# Patient Record
Sex: Female | Born: 1965
Health system: Southern US, Community
[De-identification: ages and names within clinical notes are randomized; demographics above are authoritative.]

## PROBLEM LIST (undated history)

## (undated) DIAGNOSIS — F32A Depression, unspecified: Secondary | ICD-10-CM

## (undated) DIAGNOSIS — E785 Hyperlipidemia, unspecified: Secondary | ICD-10-CM

## (undated) DIAGNOSIS — D649 Anemia, unspecified: Secondary | ICD-10-CM

## (undated) DIAGNOSIS — R112 Nausea with vomiting, unspecified: Secondary | ICD-10-CM

---

## 2020-03-14 ENCOUNTER — Other Ambulatory Visit: Payer: Self-pay | Admitting: Orthopedic Surgery

## 2020-03-14 DIAGNOSIS — M545 Low back pain, unspecified: Secondary | ICD-10-CM

## 2020-03-18 ENCOUNTER — Other Ambulatory Visit: Payer: Self-pay

## 2020-03-18 ENCOUNTER — Ambulatory Visit (INDEPENDENT_AMBULATORY_CARE_PROVIDER_SITE_OTHER): Payer: BC Managed Care – PPO

## 2020-03-18 DIAGNOSIS — M545 Low back pain, unspecified: Secondary | ICD-10-CM

## 2021-11-06 IMAGING — MR MR LUMBAR SPINE W/O CM
4 of 5 series · 25 of 48 positions shown · non-contrast
Comparison: No pertinent prior studies available for comparison.

CLINICAL DATA: Low back pain, unspecified back pain laterality,
unspecified chronicity, unspecified whether sciatica present.
Evaluate neurologic compression. Additional history provided by
technologist: Patient reports extreme low back pain for 6 months,
bilateral leg weakness.

EXAM:
MRI LUMBAR SPINE WITHOUT CONTRAST
TECHNIQUE: Multiplanar, multisequence MR imaging of the lumbar spine was
performed. No intravenous contrast was administered.

[Series 4: T2 · sagittal · 4.0mm · 0.81mm/px · 6 of 15 slices shown (1 of 2)]
[im 1/15]
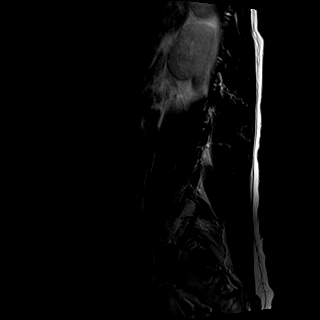
[im 3/15]
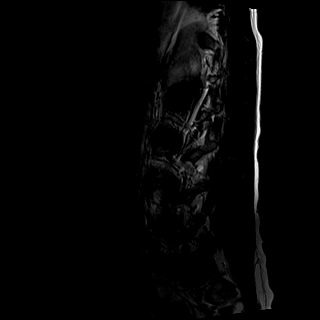
[im 6/15]
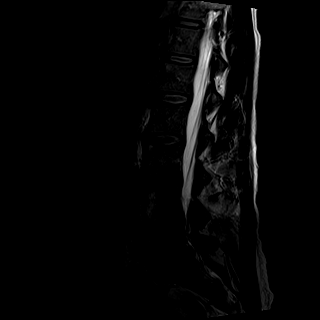
[im 9/15]
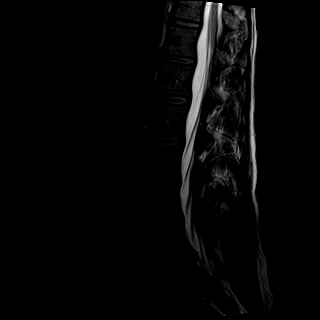
[im 12/15]
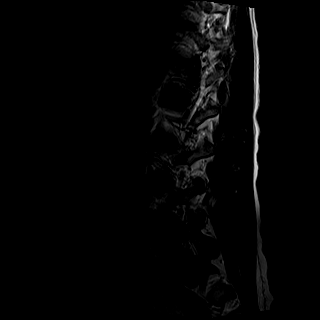
[im 15/15]
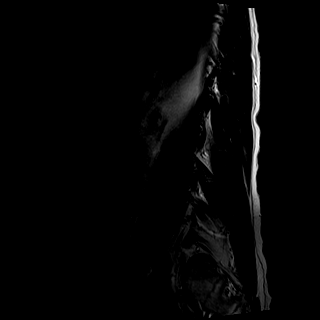

[Series 5: T1 · sagittal · 4.0mm · 0.41mm/px · 6 of 15 slices shown (1 of 2)]
[im 1/15]
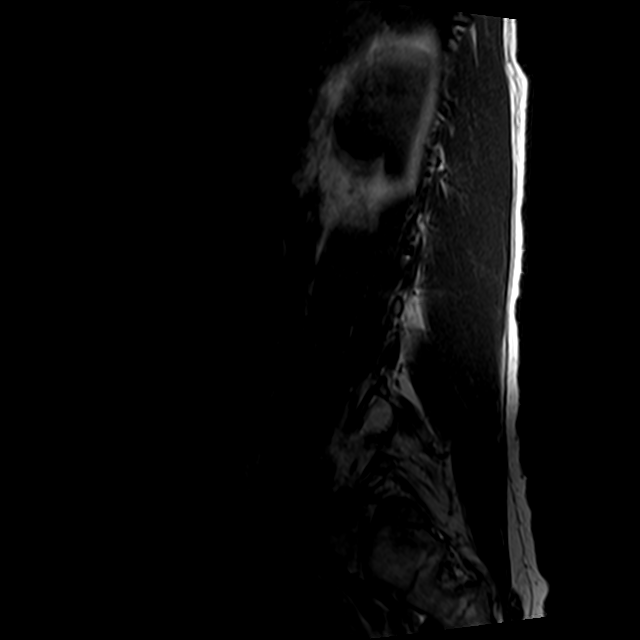
[im 3/15]
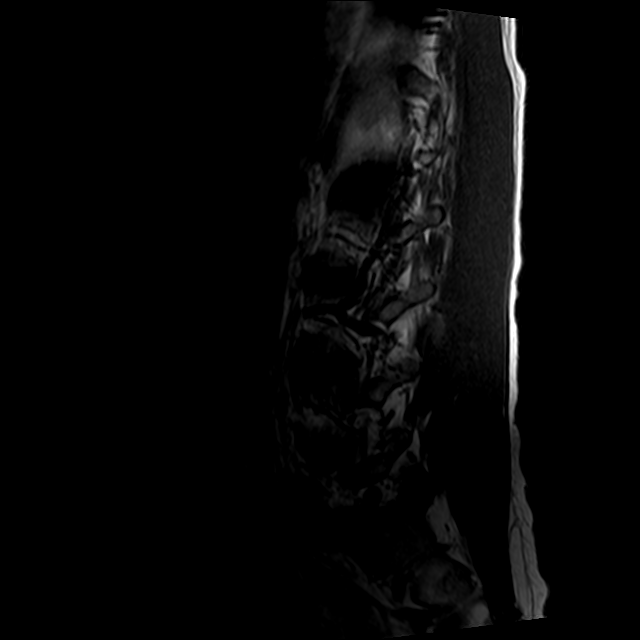
[im 6/15]
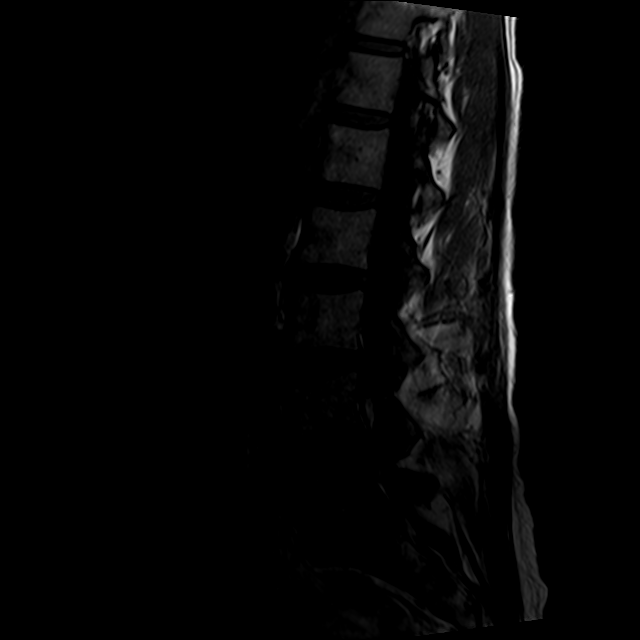
[im 9/15]
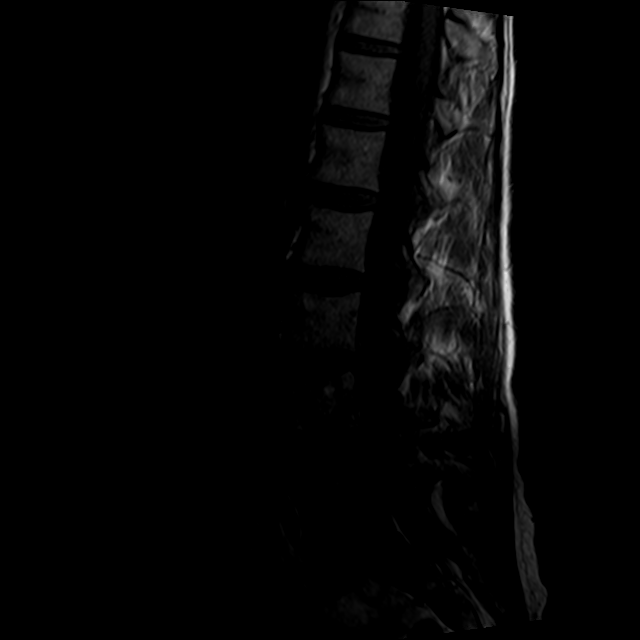
[im 12/15]
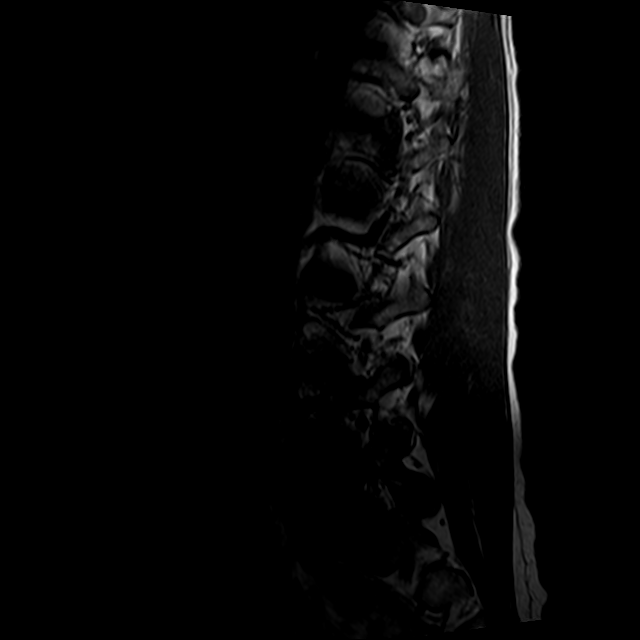
[im 15/15]
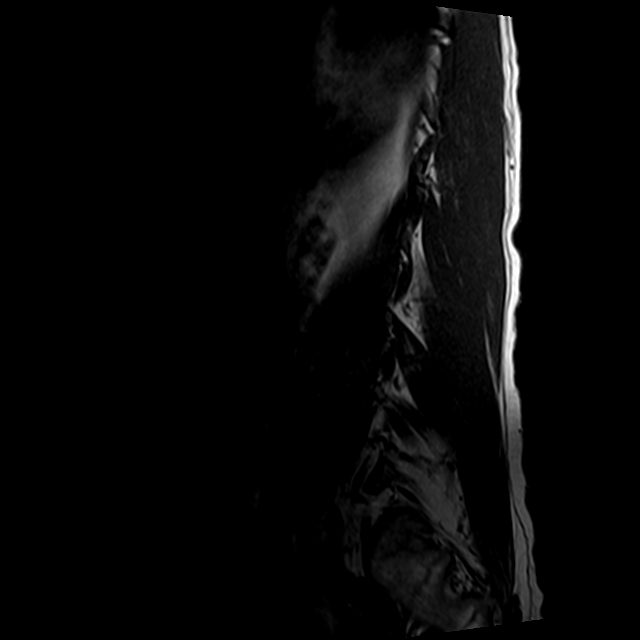

[Series 9: T2 · axial · 4.0mm · 0.78mm/px · z∈[-148,+64]mm · 9 of 39 slices shown (2 of 2)]
[im 1/39]
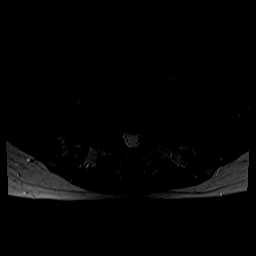
[im 6/39]
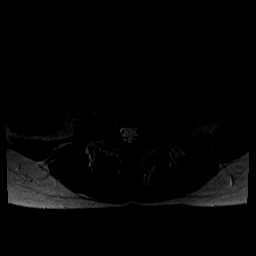
[im 11/39]
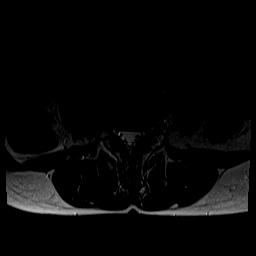
[im 17/39]
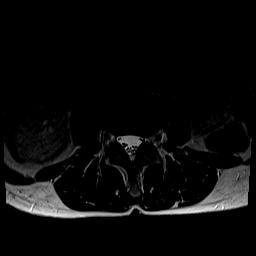
[im 20/39]
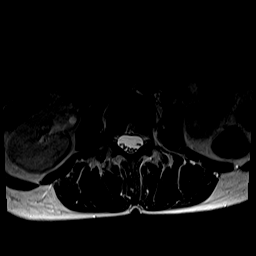
[im 22/39]
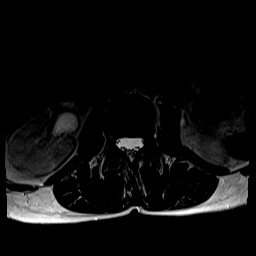
[im 28/39]
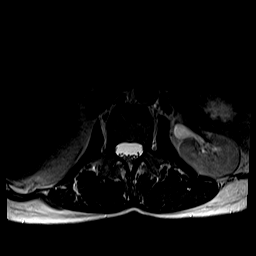
[im 33/39]
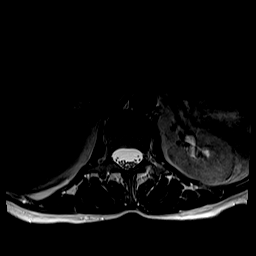
[im 39/39]
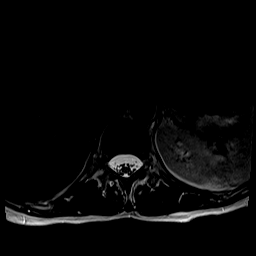

[Series 12: T1 · axial · 4.0mm · 0.39mm/px · z∈[-148,+36]mm · 4 of 39 slices shown (2 of 2)]
[im 1/39]
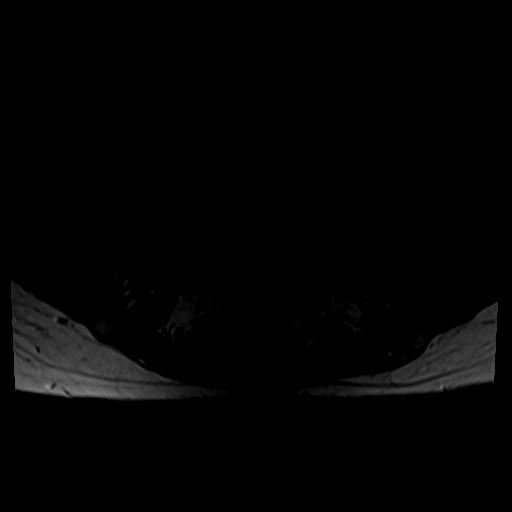
[im 6/39]
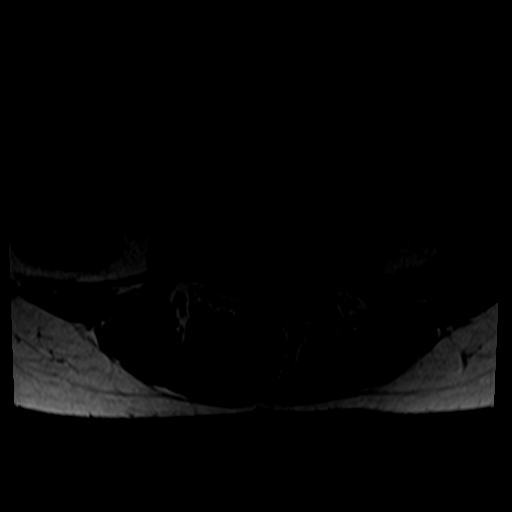
[im 20/39]
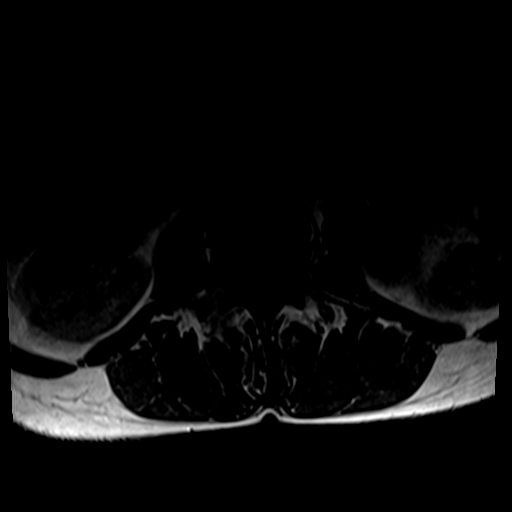
[im 33/39]
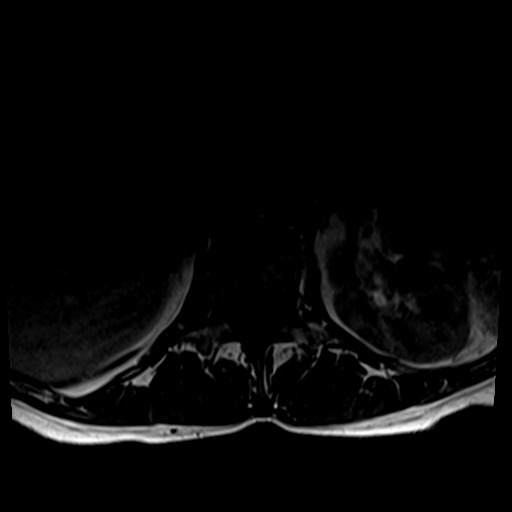

[25 of 48 positions shown; findings below may reference images not displayed]

FINDINGS: Segmentation: For the purposes of this dictation, five lumbar
vertebrae are assumed and the caudal most well-formed intervertebral
disc is designated L5-S1.

Alignment: Straightening of the expected lumbar lordosis. Trace
retrolisthesis at L3-L4, L4-L5 and L5-S1.

Vertebrae: Vertebral body height is maintained. Mild degenerative
endplate edema at L5-S1.

Conus medullaris and cauda equina: Conus extends to the T12-L1
level. No signal abnormality within the visualized distal spinal
cord.

Paraspinal and other soft tissues: No abnormality identified within
included portions of the abdomen/retroperitoneum. Visualized
paraspinal soft tissues within normal limits

Disc levels:

Moderate L5-S1 disc degeneration. Mild disc degeneration at L3-L4
and L4-L5.

T12-L1: No disc herniation. No significant canal or foraminal
stenosis.

L1-L2: No disc herniation. No significant canal or foraminal
stenosis.

L2-L3: No disc herniation. No significant canal or foraminal
stenosis.

L3-L4: Tiny left subarticular/foraminal disc protrusion at site of
posterior annular fissure. Mild facet arthrosis. No significant
spinal canal or foraminal narrowing.

L4-L5: Small disc bulge. Mild facet arthrosis/ligamentum flavum
hypertrophy. Mild left subarticular narrowing without nerve root
impingement. Central canal patent. No significant foraminal
stenosis.

L5-S1: Disc bulge. Superimposed shallow broad-based central disc
protrusion. Mild facet arthrosis/ligamentum flavum hypertrophy.
Mild/moderate bilateral subarticular narrowing with slight crowding
of the descending S1 nerve roots. Central canal patent.
Mild/moderate left neural foraminal narrowing.
IMPRESSION: Lumbar spondylosis as outlined and most notably as follows.

At L5-S1, there is moderate disc degeneration with mild degenerative
endplate edema. Disc bulge with superimposed shallow broad-based
central disc protrusion. Mild facet arthrosis/ligamentum flavum
hypertrophy. Mild/moderate bilateral subarticular narrowing with
slight crowding of the descending S1 nerve roots. Central canal
patent. Mild/moderate left neural foraminal narrowing.

At L4-L5, small disc bulge. Mild facet arthrosis/ligamentum flavum
hypertrophy. Mild left subarticular narrowing without nerve root
impingement. Central canal and neural foramina patent.

At L3-L4, tiny left subarticular/foraminal disc protrusion at site
of posterior annular fissure. Mild facet arthrosis. No significant
canal or foraminal stenosis.

## 2023-01-13 ENCOUNTER — Encounter: Payer: Self-pay | Admitting: Orthopaedic Surgery

## 2023-01-13 ENCOUNTER — Ambulatory Visit (INDEPENDENT_AMBULATORY_CARE_PROVIDER_SITE_OTHER): Payer: BC Managed Care – PPO | Admitting: Orthopaedic Surgery

## 2023-01-13 VITALS — Ht 63.0 in | Wt 109.6 lb

## 2023-01-13 DIAGNOSIS — M25551 Pain in right hip: Secondary | ICD-10-CM

## 2023-01-13 NOTE — Progress Notes (Signed)
The patient is a thin and active 57 year old female who saw me for second opinion as a relates to her right hip.  She started to develop right hip pain about a year ago and actually in January 2023 went to the emergency room because her hip was hurting quite a bit.  Her x-rays are negative and eventually she went through physical therapy for her hip.  She is also seeing a spine specialist.  She has had some knee pain on occasion on the right side but she does have stiffness in the right hip and some pain in the groin but also around the trochanteric area of her right hip.  She has seen a spine specialist and it sounds like she had an SI joint injection on the right side.  She has an MRI of her right hip that accompanies her.  I was able to look at the MRI and she does have a anterior superior labral tear but is not any significant degenerative changes at all around the femoral head or acetabulum.  The cartilage appears intact.  There is some fluid changes around the gluteus medius and minimus attachment area.  She did have an intra-articular injection in that right hip under fluoroscopic guidance back in December and she said that she cannot really tell a difference in terms of her being helped from that injection.  She has seen a hip arthroscopy specialist, Dr. Aretha Parrot, in [place].  Again she is a very thin individual and very active.  She does not have any significant medical problems at all.  On exam her right hip moves smoothly and fluidly with pain in the groin area on the strings or rotation but some pain over the proximal trochanteric area and some in the anterior thigh as well.  Her knee exam for me is normal and she appears very flexible with her spine.  I would ask like to send her to my partner Dr. Sammuel Hines to further evaluate her right hip in terms of any pathology around the trochanteric area that may need further intervention as well as assess her hip further given the labral tear.  She is just  a little frustrated in terms of how she feels however she is cautious and does want to make sure that she is doing the right things in terms of thinking about what can be done to help her feel better and does not want to rush directly to surgery unless there is a good idea of what may help her.  We will work on making that referral and she is glad to have a referral.  We gave her her MRI disc back to show Dr. Sammuel Hines as well.

## 2023-01-21 ENCOUNTER — Ambulatory Visit (INDEPENDENT_AMBULATORY_CARE_PROVIDER_SITE_OTHER): Payer: BC Managed Care – PPO | Admitting: Orthopaedic Surgery

## 2023-01-21 DIAGNOSIS — M25551 Pain in right hip: Secondary | ICD-10-CM

## 2023-01-21 DIAGNOSIS — M67959 Unspecified disorder of synovium and tendon, unspecified thigh: Secondary | ICD-10-CM

## 2023-01-21 DIAGNOSIS — M67951 Unspecified disorder of synovium and tendon, right thigh: Secondary | ICD-10-CM | POA: Diagnosis not present

## 2023-01-21 MED ORDER — TRIAMCINOLONE ACETONIDE 40 MG/ML IJ SUSP
80.0000 mg | INTRAMUSCULAR | Status: AC | PRN
  Administered 2023-01-21: 80 mg via INTRA_ARTICULAR

## 2023-01-21 MED ORDER — LIDOCAINE HCL 1 % IJ SOLN
4.0000 mL | INTRAMUSCULAR | Status: AC | PRN
  Administered 2023-01-21: 4 mL

## 2023-01-21 NOTE — Progress Notes (Signed)
Chief Complaint: Right hip pain     History of Present Illness:    Laura House is a 57 y.o. female presents today with ongoing right hip pain since December 2023.  She has previously seen by Dr. Aretha Parrot who was working on her right hip up and ultimately recommended right hip arthroscopy.  An injection was performed prior to this which gave her 2 weeks of relief.  Since this time she is having predominantly lateral based pain.  She does have a history of doing physical therapy for her back although nothing dedicated for the hip.  She does have a history of sharp stabbing pain down the anterior lateral thigh.  She is here today for further discussion.  She did see Dr. Ninfa Linden who sent her here today for further discussion and referral.  She was previously very active running multiple miles although this is stopped near completely as result of her hip pain    Surgical History:   none  PMH/PSH/Family History/Social History/Meds/Allergies:   No past medical history on file. No past surgical history on file. Social History   Socioeconomic History  . Marital status: Unknown    Spouse name: Not on file  . Number of children: Not on file  . Years of education: Not on file  . Highest education level: Not on file  Occupational History  . Not on file  Tobacco Use  . Smoking status: Not on file  . Smokeless tobacco: Not on file  Substance and Sexual Activity  . Alcohol use: Not on file  . Drug use: Not on file  . Sexual activity: Not on file  Other Topics Concern  . Not on file  Social History Narrative  . Not on file   Social Determinants of Health   Financial Resource Strain: Not on file  Food Insecurity: Not on file  Transportation Needs: Not on file  Physical Activity: Not on file  Stress: Not on file  Social Connections: Not on file   No family history on file. Not on File No current outpatient medications on file.   No current  facility-administered medications for this visit.   No results found.  Review of Systems:   A ROS was performed including pertinent positives and negatives as documented in the HPI.  Physical Exam :   Constitutional: NAD and appears stated age Neurological: Alert and oriented Psych: Appropriate affect and cooperative There were no vitals taken for this visit.   Comprehensive Musculoskeletal Exam:    Inspection Right Left  Skin No atrophy or gross abnormalities appreciated No atrophy or gross abnormalities appreciated  Palpation    Tenderness None None  Crepitus None None  Range of Motion    Flexion (passive) 120 120  Extension 30 30  IR 30 no pain 30  ER 45 45  Strength    Flexion  5/5 5/5  Extension 5/5 5/5  Special Tests    FABER Negative Negative  FADIR Negative Negative  ER Lag/Capsular Insufficiency Negative Negative  Instability Negative Negative  Sacroiliac pain Negative  Negative   Instability    Generalized Laxity No No  Neurologic    sciatic, femoral, obturator nerves intact to light sensation  Vascular/Lymphatic    DP pulse 2+ 2+  Lumbar Exam    Patient has symmetric lumbar range of  motion with negative pain referral to hip   She has positive pain with resisted hip abduction about the trochanter.  Imaging:   Xray (3 views right hip): Normal  MRI (right hip): There is a diminutive appearing labrum with an anterior superior labral tear.  There is tendinosis about the gluteus minimus and medius without a frank tear  I personally reviewed and interpreted the radiographs.   Assessment:   57 y.o. female with right hip pain that is consistent with right hip instability.  At today's visit I did describe that she does have quite a diminutive appearing labrum which is torn which I believe is creating a larger stress on her secondary hip stabilizers.  At today's visit the majority of her pain is gluteal in nature.  I believe that this is resulting from an  increased amount of stress about the lateral glutes as result of her labral tear.  I did describe that I do believe that her hip injection is likely still effective as she is still not having any groin pain.  Provocative labral maneuvers are not positive as well.  Today the majority of her pain appears to be lateral in nature.  Side effect I do believe that starting with an ultrasound guided injection about the glutes and a strong gluteal strengthening program could provide her with the strength and secondary stabilization needed to ultimately get her outcome that she is satisfied with and begin a good running program.  I will plan to proceed with this and I will see her back in 6 weeks for reassessment to check on her progress  Plan :    -Right hip ultrasound-guided gluteus medius injection informed verbal consent obtained -Physical therapy program for gluteal strengthening ordered -Return to clinic in 6 months for reassessment    Procedure Note  Patient: Laura House             Date of Birth: [birth year]           MRN: [id number]             Visit Date: 01/21/2023  Procedures: Visit Diagnoses: No diagnosis found.  Large Joint Inj: R greater trochanter on 01/21/2023 3:56 PM Indications: pain Details: 22 G 3.5 in needle, ultrasound-guided anterolateral approach  Arthrogram: No  Medications: 4 mL lidocaine 1 %; 80 mg triamcinolone acetonide 40 MG/ML Outcome: tolerated well, no immediate complications Procedure, treatment alternatives, risks and benefits explained, specific risks discussed. Consent was given by the patient. Immediately prior to procedure a time out was called to verify the correct patient, procedure, equipment, support staff and site/side marked as required. Patient was prepped and draped in the usual sterile fashion.          I personally saw and evaluated the patient, and participated in the management and treatment plan.  Vanetta Mulders, MD Attending  Physician, Orthopedic Surgery  This document was dictated using Dragon voice recognition software. A reasonable attempt at proof reading has been made to minimize errors.

## 2023-02-06 ENCOUNTER — Ambulatory Visit (INDEPENDENT_AMBULATORY_CARE_PROVIDER_SITE_OTHER): Payer: BC Managed Care – PPO | Admitting: Orthopaedic Surgery

## 2023-02-06 DIAGNOSIS — M67959 Unspecified disorder of synovium and tendon, unspecified thigh: Secondary | ICD-10-CM | POA: Diagnosis not present

## 2023-02-06 DIAGNOSIS — S73191A Other sprain of right hip, initial encounter: Secondary | ICD-10-CM

## 2023-02-06 NOTE — Progress Notes (Signed)
Chief Complaint: Right hip pain     History of Present Illness:   02/06/2023: Presents today for her right hip.  Unfortunately she did not get any significant relief from the lateral based injection.  She states that her groin pain has recurred and has been problematic.  Her pain is somewhat diffuse and achy this time.  She has not been able to undergo physical therapy for the right hip.  Laura House is a 57 y.o. female presents today with ongoing right hip pain since December 2023.  She has previously seen by Dr. Aretha Parrot who was working on her right hip up and ultimately recommended right hip arthroscopy.  An injection was performed prior to this which gave her 2 weeks of relief.  Since this time she is having predominantly lateral based pain.  She does have a history of doing physical therapy for her back although nothing dedicated for the hip.  She does have a history of sharp stabbing pain down the anterior lateral thigh.  She is here today for further discussion.  She did see Dr. Ninfa Linden who sent her here today for further discussion and referral.  She was previously very active running multiple miles although this is stopped near completely as result of her hip pain    Surgical History:   none  PMH/PSH/Family History/Social History/Meds/Allergies:   No past medical history on file. No past surgical history on file. Social History   Socioeconomic History   Marital status: Unknown    Spouse name: Not on file   Number of children: Not on file   Years of education: Not on file   Highest education level: Not on file  Occupational History   Not on file  Tobacco Use   Smoking status: Not on file   Smokeless tobacco: Not on file  Substance and Sexual Activity   Alcohol use: Not on file   Drug use: Not on file   Sexual activity: Not on file  Other Topics Concern   Not on file  Social History Narrative   Not on file   Social Determinants of  Health   Financial Resource Strain: Not on file  Food Insecurity: Not on file  Transportation Needs: Not on file  Physical Activity: Not on file  Stress: Not on file  Social Connections: Not on file   No family history on file. Not on File No current outpatient medications on file.   No current facility-administered medications for this visit.   No results found.  Review of Systems:   A ROS was performed including pertinent positives and negatives as documented in the HPI.  Physical Exam :   Constitutional: NAD and appears stated age Neurological: Alert and oriented Psych: Appropriate affect and cooperative There were no vitals taken for this visit.   Comprehensive Musculoskeletal Exam:    Inspection Right Left  Skin No atrophy or gross abnormalities appreciated No atrophy or gross abnormalities appreciated  Palpation    Tenderness None None  Crepitus None None  Range of Motion    Flexion (passive) 120 120  Extension 30 30  IR 30 mild pain 30  ER 45 45  Strength    Flexion  5/5 5/5  Extension 5/5 5/5  Special Tests    FABER Negative Negative  FADIR Mildly positive Negative  ER Lag/Capsular Insufficiency Negative Negative  Instability Negative Negative  Sacroiliac pain Negative  Negative   Instability    Generalized Laxity No No  Neurologic    sciatic, femoral, obturator nerves intact to light sensation  Vascular/Lymphatic    DP pulse 2+ 2+  Lumbar Exam    Patient has symmetric lumbar range of motion with negative pain referral to hip   She has positive pain with resisted hip abduction about the trochanter.  Imaging:   Xray (3 views right hip): Normal  MRI (right hip): There is a diminutive appearing labrum with an anterior superior labral tear.  There is tendinosis about the gluteus minimus and medius without a frank tear  I personally reviewed and interpreted the radiographs.   Assessment:   57 y.o. female with right hip pain that is consistent  with right hip instability.  At today's visit I did describe that she does have quite a diminutive appearing labrum which is torn which I believe is creating a larger stress on her secondary hip stabilizers.  At today's visit the majority of her pain is gluteal in nature.  I believe that this is resulting from an increased amount of stress about the lateral glutes as result of her labral tear.  Unfortunately she did not get any relief from her gluteal injection to that effect I do believe that the majority of her symptoms are emanating from the labrum and subsequent instability.  To this effect I do believe that strengthening with physical therapy would be the best initial treatment in order to give her the best possibility of avoiding surgery.  This time she will begin physical therapy for strengthening of the right hip.  I will plan to see her back in 6 weeks for reassessment Plan :    -Return to clinic in 6 weeks for reassessment       I personally saw and evaluated the patient, and participated in the management and treatment plan.  Vanetta Mulders, MD Attending Physician, Orthopedic Surgery  This document was dictated using Dragon voice recognition software. A reasonable attempt at proof reading has been made to minimize errors.

## 2023-03-18 ENCOUNTER — Ambulatory Visit (HOSPITAL_BASED_OUTPATIENT_CLINIC_OR_DEPARTMENT_OTHER): Payer: BC Managed Care – PPO | Admitting: Orthopaedic Surgery

## 2023-03-20 ENCOUNTER — Ambulatory Visit (HOSPITAL_BASED_OUTPATIENT_CLINIC_OR_DEPARTMENT_OTHER): Payer: BC Managed Care – PPO | Admitting: Orthopaedic Surgery

## 2023-03-27 ENCOUNTER — Other Ambulatory Visit: Payer: Self-pay

## 2023-03-27 ENCOUNTER — Ambulatory Visit (HOSPITAL_BASED_OUTPATIENT_CLINIC_OR_DEPARTMENT_OTHER): Payer: BC Managed Care – PPO | Attending: Orthopaedic Surgery | Admitting: Physical Therapy

## 2023-03-27 ENCOUNTER — Encounter (HOSPITAL_BASED_OUTPATIENT_CLINIC_OR_DEPARTMENT_OTHER): Payer: Self-pay | Admitting: Physical Therapy

## 2023-03-27 DIAGNOSIS — R262 Difficulty in walking, not elsewhere classified: Secondary | ICD-10-CM | POA: Insufficient documentation

## 2023-03-27 DIAGNOSIS — M67959 Unspecified disorder of synovium and tendon, unspecified thigh: Secondary | ICD-10-CM | POA: Diagnosis not present

## 2023-03-27 DIAGNOSIS — M25551 Pain in right hip: Secondary | ICD-10-CM | POA: Diagnosis present

## 2023-03-27 DIAGNOSIS — M6281 Muscle weakness (generalized): Secondary | ICD-10-CM | POA: Diagnosis present

## 2023-03-27 NOTE — Therapy (Signed)
OUTPATIENT PHYSICAL THERAPY LOWER EXTREMITY EVALUATION   Patient Name: Laura House MRN: [id number] DOB:Sep 16, 1966, 57 y.o., female Today's Date: 03/27/2023  END OF SESSION:  PT End of Session - 03/27/23 0941     Visit Number 1    Number of Visits 15    Date for PT Re-Evaluation 06/25/23    Authorization Type BCBS    PT Start Time 0930    PT Stop Time 1015    PT Time Calculation (min) 45 min    Activity Tolerance Patient tolerated treatment well    Behavior During Therapy Anxious             History reviewed. No pertinent past medical history. History reviewed. No pertinent surgical history. There are no problems to display for this patient.   PCP: N/A  REFERRING PROVIDER:   Huel Cote, MD    REFERRING DIAG:  [id number] (ICD-10-CM) - Tendinopathy of gluteus medius      THERAPY DIAG:  Pain in right hip  Muscle weakness (generalized)  Difficulty walking  Rationale for Evaluation and Treatment: Rehabilitation  ONSET DATE: 2023 Dec.   SUBJECTIVE:   SUBJECTIVE STATEMENT: Pt states she is here today for the R hip pain. Pt was previously seen for PT for treatment of her back but nothing specifically for the hip. Pt had insidious onset of pain but could be related to dancing at her son's wedding. Pt states she was an avid runner but has not been able to run for the past 2 years. Pt has tried injections with some mild relief but most recent injection did not last. Pt reports pain into there R leg/thigh. Pt states it was debilitating pain where she could not walk. Pt works out with a Systems analyst currently- [employer] with UE only. She is worried and afraid to work out the LE. Pt is not able to lift with ADL and house chores. Laying on stomach makes back hurt. Pt does not currently do stretching for the hip due to fear. Pt denies NT. Denies giving way. Pt does have groin pain. Denies significant/consistent crepitus but would feel "locking"  with running.   PERTINENT HISTORY: History of LBP   PAIN:  Are you having pain? Yes: NPRS scale: 3/10, 10/10 at worst Pain location: R hip/groin Pain description: throbbing,aching, dull Aggravating factors: stabbing pain into the groin and down the leg, sitting too long, standing, squatting, weighted exercise Relieving factors: Tylenol, advil, heat/ice, rest, laying on the back  PRECAUTIONS: None  WEIGHT BEARING RESTRICTIONS: No  FALLS:  Has patient fallen in last 6 months? No  LIVING ENVIRONMENT: Lives with: lives with their family Lives in: House/apartment Stairs: Yes  OCCUPATION: desk and travel jobs; owns own company  PLOF: Independent  PATIENT GOALS: return   NE OBJECTIVE:   DIAGNOSTIC FINDINGS:   None for hip in chart; reports torn labrum of the R hip  IMPRESSION: Lumbar spondylosis as outlined and most notably as follows.   At L5-S1, there is moderate disc degeneration with mild degenerative endplate edema. Disc bulge with superimposed shallow broad-based central disc protrusion. Mild facet arthrosis/ligamentum flavum hypertrophy. Mild/moderate bilateral subarticular narrowing with slight crowding of the descending S1 nerve roots. Central canal patent. Mild/moderate left neural foraminal narrowing.   At L4-L5, small disc bulge. Mild facet arthrosis/ligamentum flavum hypertrophy. Mild left subarticular narrowing without nerve root impingement. Central canal and neural foramina patent.   At L3-L4, tiny left subarticular/foraminal disc protrusion at site of posterior annular fissure. Mild facet arthrosis.  No significant canal or foraminal stenosis.  PATIENT SURVEYS:  FOTO 57 69 @ DC 11 pts MCII  COGNITION: Overall cognitive status: Within functional limits for tasks assessed     SENSATION: WFL   MUSCLE LENGTH: Hamstrings: positive supine 90/90 Positive Ely's on R  POSTURE: No Significant postural limitations  PALPATION: TTP and  hypertonicity of R VL, gluteals, deep hip rotators, and hip flexors  Lumbar ROM: limited with extension, rotation and SB; recreation of pain bilaterally; closing pattern reproduces pain  LOWER EXTREMITY ROM:  Active ROM Right eval Left eval  Hip flexion 100 p! 120  Hip extension 0 10  Hip abduction    Hip adduction    Hip internal rotation 15 p! 30   Hip external rotation 20 p! 45   (Blank rows = not tested)  LOWER EXTREMITY MMT:  MMT Right eval Left eval  Hip flexion 4/5 p! 4+/5  Hip extension 4/5 p! 4+/5  Hip abduction 4/5 p! 4+/5  Hip adduction 4/5 p! 4+/5  Hip internal rotation    Hip external rotation 4/5 p! 4+/5   (Blank rows = not tested)  LOWER EXTREMITY SPECIAL TESTS:  Hip special tests: Trendelenburg test: positive , Ely's test: positive , Hip scouring test: positive , Anterior hip impingement test: positive , Piriformis test: positive , and FABER positive  FUNCTIONAL TESTS:  Squat: apprehensive, decreased depth, offloading of R side, narrow, knee dominant vs hip dominant motion  GAIT: Distance walked: 22ft Assistive device utilized: None Level of assistance: Complete Independence Comments: decreased L step length  Stairs: reciprocal pattern without UE, hesitancy and lack of eccentric lowering control on R in terminal stance   TODAY'S TREATMENT:                                                                                                                              DATE: 4/26  Pain with figure 4 stretch (held)    Exercises - Standing Hip Flexor Stretch  - 2 x daily - 7 x weekly - 1 sets - 2 reps - 30 hold - Bridge with Posterior Pelvic Tilt  - 1 x daily - 7 x weekly - 3 sets - 10 reps - Piriformis Mobilization on Foam Roll  - 1 x daily - 7 x weekly - 1 sets - 1 reps - 2-3 min hold - Prone Quadriceps Stretch with Strap  - 2 x daily - 7 x weekly - 1 sets - 3 reps - 30 hold    PATIENT EDUCATION:  Education details: MOI, diagnosis, prognosis,  anatomy, exercise progression, DOMS expectations, muscle firing,  envelope of function, HEP, POC  Person educated: Patient Education method: Explanation, Demonstration, Tactile cues, Verbal cues, and Handouts Education comprehension: verbalized understanding, returned demonstration, verbal cues required, tactile cues required, and needs further education  HOME EXERCISE PROGRAM:  Access Code: [email or web address] URL: [email or web address] Date: 03/27/2023 Prepared by: Zebedee Iba  ASSESSMENT:  CLINICAL IMPRESSION: Patient is a 57  y.o. female who was seen today for physical therapy evaluation and treatment for c/c of R hip pain. 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 Pt does have imaging showing R hip labral defect and lumbar DDD which may be contributing factors to pt pain. Pt is very guarded with the R hip and pain inhibited with movement/motion. Pt's pain is moderately sensitive and irritable with movement. Plan to continue with R lumbopelvic mobility and strength at future sessions. Pt would benefit from continued skilled therapy in order to reach goals and maximize functional R LE strength and ROM for return to exercise and regular ADL.   OBJECTIVE IMPAIRMENTS: decreased activity tolerance, decreased balance, decreased endurance, decreased mobility, decreased ROM, decreased strength, hypomobility, increased muscle spasms, impaired flexibility, improper body mechanics, postural dysfunction, and pain.    ACTIVITY LIMITATIONS: lifting, squatting, locomotion level, and dressing   PARTICIPATION LIMITATIONS: interpersonal relationship, community activity, occupation, and exercise   PERSONAL FACTORS: Past/current experiences and Time since onset of injury/illness/exacerbation are also affecting patient's functional outcome.    REHAB POTENTIAL: Good   CLINICAL DECISION MAKING: Stable/uncomplicated    EVALUATION COMPLEXITY: Low     GOALS:     SHORT TERM GOALS: Target date: 05/08/2023   Pt will become independent with HEP in order to demonstrate synthesis of PT education.   Goal status: INITIAL   2. Pt will score at least 11 pt increase on FOTO to demonstrate functional improvement in MCII and pt perceived function.     Goal status: INITIAL   3.  Pt will report at least 2 pt reduction on NPRS scale for pain in order to demonstrate functional improvement with household activity, self care, and ADL.    Goal status: INITIAL   LONG TERM GOALS: Target date: 06/19/2023     1. Pt  will become independent with final HEP in order to demonstrate synthesis of PT education.   Goal status: INITIAL   2.  Pt will be able to demonstrate full depth squat without pain in order to demonstrate functional improvement in LE function for return to exercise/training.  Goal status: INITIAL   3. Pt will be able to demonstrate full figure 4 and ER position without pain in order to demonstrate functional improvement in LE function for self-care and return to occupation/hobby activity.    Goal status: INITIAL   4.  Pt will be able to demonstrate ability to run/jog without pain in order to demonstrate functional improvement and tolerance to low level plyometric loading.    Goal status: INITIAL   5.  Pt will score >/= 69 on FOTO to demonstrate improvement in perceived R hip function.     Goal status: INITIAL       PLAN:   PT FREQUENCY: 1x/week   PT DURATION: 12 weeks (likely D/C to at 8 wks)   PLANNED INTERVENTIONS: Therapeutic exercises, Therapeutic activity, Neuromuscular re-education, Balance training, Gait training, Patient/Family education, Self Care, Joint mobilization, Joint manipulation, Stair training, Aquatic Therapy, Dry Needling, Electrical stimulation, Spinal manipulation, Spinal mobilization, Cryotherapy, Moist heat, scar mobilization, Splintting, Taping, Vasopneumatic device,  Traction, Ultrasound, Ionotophoresis 4mg /ml Dexamethasone, Manual therapy, and Re-evaluation   PLAN FOR NEXT SESSION:  mulligan mobilizations, R L/S UPA, STM to R hip rotators, single leg stability, progressive glute strength; consider TPDN edu at next for PRN usage   Zebedee Iba, PT 03/27/2023, 12:12 PM

## 2023-04-09 ENCOUNTER — Encounter (HOSPITAL_BASED_OUTPATIENT_CLINIC_OR_DEPARTMENT_OTHER): Payer: Self-pay

## 2023-04-09 ENCOUNTER — Ambulatory Visit (HOSPITAL_BASED_OUTPATIENT_CLINIC_OR_DEPARTMENT_OTHER): Payer: BC Managed Care – PPO | Attending: Orthopaedic Surgery

## 2023-04-09 DIAGNOSIS — M25551 Pain in right hip: Secondary | ICD-10-CM | POA: Diagnosis present

## 2023-04-09 DIAGNOSIS — R262 Difficulty in walking, not elsewhere classified: Secondary | ICD-10-CM | POA: Diagnosis present

## 2023-04-09 DIAGNOSIS — M6281 Muscle weakness (generalized): Secondary | ICD-10-CM

## 2023-04-09 NOTE — Therapy (Signed)
OUTPATIENT PHYSICAL THERAPY LOWER EXTREMITY TREATMENT   Patient Name: Laura House MRN: [id number] DOB:1966/06/27, 57 y.o., female Today's Date: 04/09/2023  END OF SESSION:  PT End of Session - 04/09/23 8295     Visit Number 2    Number of Visits 15    Date for PT Re-Evaluation 06/25/23    Authorization Type BCBS    PT Start Time 0802    PT Stop Time 0850    PT Time Calculation (min) 48 min    Activity Tolerance Patient tolerated treatment well    Behavior During Therapy [facility] for tasks assessed/performed              History reviewed. No pertinent past medical history. History reviewed. No pertinent surgical history. There are no problems to display for this patient.   PCP: N/A  REFERRING PROVIDER:   Huel Cote, MD    REFERRING DIAG:  [phone] (ICD-10-CM) - Tendinopathy of gluteus medius      THERAPY DIAG:  Pain in right hip  Muscle weakness (generalized)  Difficulty walking  Rationale for Evaluation and Treatment: Rehabilitation  ONSET DATE: 2023 Dec.   SUBJECTIVE:   SUBJECTIVE STATEMENT: Pt reports 3/10 pain level in anterior hip at entry. Reports diligent HEP compliance and walking every 30 minutes. Feels that the PT may be aggravating her hip.   EVAL: Pt states she is here today for the R hip pain. Pt was previously seen for PT for treatment of her back but nothing specifically for the hip. Pt had insidious onset of pain but could be related to dancing at her son's wedding. Pt states she was an avid runner but has not been able to run for the past 2 years. Pt has tried injections with some mild relief but most recent injection did not last. Pt reports pain into there R leg/thigh. Pt states it was debilitating pain where she could not walk. Pt works out with a Systems analyst currently- [employer] with UE only. She is worried and afraid to work out the LE. Pt is not able to lift with ADL and house chores. Laying on stomach  makes back hurt. Pt does not currently do stretching for the hip due to fear. Pt denies NT. Denies giving way. Pt does have groin pain. Denies significant/consistent crepitus but would feel "locking" with running.   PERTINENT HISTORY: History of LBP   PAIN:  Are you having pain? Yes: NPRS scale: 3/10, 10/10 at worst Pain location: R hip/groin Pain description: throbbing,aching, dull Aggravating factors: stabbing pain into the groin and down the leg, sitting too long, standing, squatting, weighted exercise Relieving factors: Tylenol, advil, heat/ice, rest, laying on the back  PRECAUTIONS: None  WEIGHT BEARING RESTRICTIONS: No  FALLS:  Has patient fallen in last 6 months? No  LIVING ENVIRONMENT: Lives with: lives with their family Lives in: House/apartment Stairs: Yes  OCCUPATION: desk and travel jobs; owns own company  PLOF: Independent  PATIENT GOALS: return   NE OBJECTIVE:   DIAGNOSTIC FINDINGS:   None for hip in chart; reports torn labrum of the R hip  IMPRESSION: Lumbar spondylosis as outlined and most notably as follows.   At L5-S1, there is moderate disc degeneration with mild degenerative endplate edema. Disc bulge with superimposed shallow broad-based central disc protrusion. Mild facet arthrosis/ligamentum flavum hypertrophy. Mild/moderate bilateral subarticular narrowing with slight crowding of the descending S1 nerve roots. Central canal patent. Mild/moderate left neural foraminal narrowing.   At L4-L5, small disc bulge. Mild facet  arthrosis/ligamentum flavum hypertrophy. Mild left subarticular narrowing without nerve root impingement. Central canal and neural foramina patent.   At L3-L4, tiny left subarticular/foraminal disc protrusion at site of posterior annular fissure. Mild facet arthrosis. No significant canal or foraminal stenosis.  PATIENT SURVEYS:  FOTO 57 69 @ DC 11 pts MCII  COGNITION: Overall cognitive status: Within functional limits  for tasks assessed     SENSATION: WFL   MUSCLE LENGTH: Hamstrings: positive supine 90/90 Positive Ely's on R  POSTURE: No Significant postural limitations  PALPATION: TTP and hypertonicity of R VL, gluteals, deep hip rotators, and hip flexors  Lumbar ROM: limited with extension, rotation and SB; recreation of pain bilaterally; closing pattern reproduces pain  LOWER EXTREMITY ROM:  Active ROM Right eval Left eval  Hip flexion 100 p! 120  Hip extension 0 10  Hip abduction    Hip adduction    Hip internal rotation 15 p! 30   Hip external rotation 20 p! 45   (Blank rows = not tested)  LOWER EXTREMITY MMT:  MMT Right eval Left eval  Hip flexion 4/5 p! 4+/5  Hip extension 4/5 p! 4+/5  Hip abduction 4/5 p! 4+/5  Hip adduction 4/5 p! 4+/5  Hip internal rotation    Hip external rotation 4/5 p! 4+/5   (Blank rows = not tested)  LOWER EXTREMITY SPECIAL TESTS:  Hip special tests: Trendelenburg test: positive , Ely's test: positive , Hip scouring test: positive , Anterior hip impingement test: positive , Piriformis test: positive , and FABER positive  FUNCTIONAL TESTS:  Squat: apprehensive, decreased depth, offloading of R side, narrow, knee dominant vs hip dominant motion  GAIT: Distance walked: 16ft Assistive device utilized: None Level of assistance: Complete Independence Comments: decreased L step length  Stairs: reciprocal pattern without UE, hesitancy and lack of eccentric lowering control on R in terminal stance   TODAY'S TREATMENT:                                                                                                                               DATE: 5/9 -PROM R hip -manual HSS, adductor, and hip flexor stretch -STM R piriformis, proximal adductors, proximal quad/hip flexor, R psoas -Sidelying clam x20R -Prone hip extension with knee flexed (pillow under abdomen) 2x10R -Foam roll instruction -HEP review/update    DATE: 4/26  Pain with  figure 4 stretch (held)    Exercises - Standing Hip Flexor Stretch  - 2 x daily - 7 x weekly - 1 sets - 2 reps - 30 hold - Bridge with Posterior Pelvic Tilt  - 1 x daily - 7 x weekly - 3 sets - 10 reps - Piriformis Mobilization on Foam Roll  - 1 x daily - 7 x weekly - 1 sets - 1 reps - 2-3 min hold - Prone Quadriceps Stretch with Strap  - 2 x daily - 7 x weekly - 1 sets - 3 reps - 30 hold  PATIENT EDUCATION:  Education details: MOI, diagnosis, prognosis, anatomy, exercise progression, DOMS expectations, muscle firing,  envelope of function, HEP, POC  Person educated: Patient Education method: Explanation, Demonstration, Tactile cues, Verbal cues, and Handouts Education comprehension: verbalized understanding, returned demonstration, verbal cues required, tactile cues required, and needs further education  HOME EXERCISE PROGRAM:  Access Code: [email or web address] URL: [email or web address] Date: 03/27/2023 Prepared by: Zebedee Iba  ASSESSMENT:  CLINICAL IMPRESSION: Pt very tight to palpation in proximal adductors. Also noted trigger point in proximal quad/hip flexor below ASIS. Tender as well in R psoas m. STM performed to above areas to decrease restrictions. Educated pt about DOMS and management for this at home. Trialed gentle R hip strngthening without c/o pain, only fatigue. Pt instructed to hold on bridges at home as this has been bothersome to her back. Reviewed foam rolling technique as pt was unsure if she was performing this correctly. Instructed pt to remain within pain limitations with exercises. Will monitor pain level and progress as tolerated.   OBJECTIVE IMPAIRMENTS: decreased activity tolerance, decreased balance, decreased endurance, decreased mobility, decreased ROM, decreased strength, hypomobility, increased muscle spasms, impaired flexibility, improper body mechanics, postural dysfunction, and pain.    ACTIVITY LIMITATIONS: lifting, squatting, locomotion level,  and dressing   PARTICIPATION LIMITATIONS: interpersonal relationship, community activity, occupation, and exercise   PERSONAL FACTORS: Past/current experiences and Time since onset of injury/illness/exacerbation are also affecting patient's functional outcome.    REHAB POTENTIAL: Good   CLINICAL DECISION MAKING: Stable/uncomplicated   EVALUATION COMPLEXITY: Low     GOALS:     SHORT TERM GOALS: Target date: 05/08/2023   Pt will become independent with HEP in order to demonstrate synthesis of PT education.   Goal status: IN PROGRESS 5/9 2. Pt will score at least 11 pt increase on FOTO to demonstrate functional improvement in MCII and pt perceived function.     Goal status: INITIAL   3.  Pt will report at least 2 pt reduction on NPRS scale for pain in order to demonstrate functional improvement with household activity, self care, and ADL.    Goal status: INITIAL   LONG TERM GOALS: Target date: 06/19/2023     1. Pt  will become independent with final HEP in order to demonstrate synthesis of PT education.   Goal status: INITIAL   2.  Pt will be able to demonstrate full depth squat without pain in order to demonstrate functional improvement in LE function for return to exercise/training.  Goal status: INITIAL   3. Pt will be able to demonstrate full figure 4 and ER position without pain in order to demonstrate functional improvement in LE function for self-care and return to occupation/hobby activity.    Goal status: INITIAL   4.  Pt will be able to demonstrate ability to run/jog without pain in order to demonstrate functional improvement and tolerance to low level plyometric loading.    Goal status: INITIAL   5.  Pt will score >/= 69 on FOTO to demonstrate improvement in perceived R hip function.     Goal status: INITIAL       PLAN:   PT FREQUENCY: 1x/week   PT DURATION: 12 weeks (likely D/C to at 8 wks)   PLANNED INTERVENTIONS: Therapeutic exercises, Therapeutic  activity, Neuromuscular re-education, Balance training, Gait training, Patient/Family education, Self Care, Joint mobilization, Joint manipulation, Stair training, Aquatic Therapy, Dry Needling, Electrical stimulation, Spinal manipulation, Spinal mobilization, Cryotherapy, Moist heat, scar mobilization, Splintting, Taping, Vasopneumatic device, Traction, Ultrasound, Ionotophoresis 4mg /ml Dexamethasone,  Manual therapy, and Re-evaluation   PLAN FOR NEXT SESSION:  mulligan mobilizations, R L/S UPA, STM to R hip rotators, single leg stability, progressive glute strength; consider TPDN edu at next for PRN usage   Donnel Saxon Corin Formisano, PTA 04/09/2023, 9:23 AM

## 2023-04-16 ENCOUNTER — Encounter (HOSPITAL_BASED_OUTPATIENT_CLINIC_OR_DEPARTMENT_OTHER): Payer: BC Managed Care – PPO

## 2023-04-17 ENCOUNTER — Encounter (HOSPITAL_BASED_OUTPATIENT_CLINIC_OR_DEPARTMENT_OTHER): Payer: Self-pay | Admitting: Physical Therapy

## 2023-04-17 ENCOUNTER — Ambulatory Visit (HOSPITAL_BASED_OUTPATIENT_CLINIC_OR_DEPARTMENT_OTHER): Payer: BC Managed Care – PPO | Admitting: Physical Therapy

## 2023-04-17 DIAGNOSIS — M6281 Muscle weakness (generalized): Secondary | ICD-10-CM

## 2023-04-17 DIAGNOSIS — M25551 Pain in right hip: Secondary | ICD-10-CM | POA: Diagnosis not present

## 2023-04-17 DIAGNOSIS — R262 Difficulty in walking, not elsewhere classified: Secondary | ICD-10-CM

## 2023-04-17 NOTE — Therapy (Signed)
OUTPATIENT PHYSICAL THERAPY LOWER EXTREMITY TREATMENT   Patient Name: Laura House MRN: [id number] DOB:1966-05-21, 57 y.o., female Today's Date: 04/17/2023  END OF SESSION:  PT End of Session - 04/17/23 0823     Visit Number 3    Number of Visits 15    Date for PT Re-Evaluation 06/25/23    Authorization Type BCBS    PT Start Time 0803    PT Stop Time 0843    PT Time Calculation (min) 40 min    Activity Tolerance Patient tolerated treatment well    Behavior During Therapy [facility] for tasks assessed/performed              History reviewed. No pertinent past medical history. History reviewed. No pertinent surgical history. There are no problems to display for this patient.   PCP: N/A  REFERRING PROVIDER:   Huel Cote, MD    REFERRING DIAG:  [phone] (ICD-10-CM) - Tendinopathy of gluteus medius      THERAPY DIAG:  Pain in right hip  Muscle weakness (generalized)  Difficulty walking  Rationale for Evaluation and Treatment: Rehabilitation  ONSET DATE: 2023 Dec.   SUBJECTIVE:   SUBJECTIVE STATEMENT: Pt states the hip just feels tight but her back is bothering her more. She is walking for 30 mins at a time still but the R LE feels weak and heavy.    EVAL: Pt states she is here today for the R hip pain. Pt was previously seen for PT for treatment of her back but nothing specifically for the hip. Pt had insidious onset of pain but could be related to dancing at her son's wedding. Pt states she was an avid runner but has not been able to run for the past 2 years. Pt has tried injections with some mild relief but most recent injection did not last. Pt reports pain into there R leg/thigh. Pt states it was debilitating pain where she could not walk. Pt works out with a Systems analyst currently- [employer] with UE only. She is worried and afraid to work out the LE. Pt is not able to lift with ADL and house chores. Laying on stomach makes back  hurt. Pt does not currently do stretching for the hip due to fear. Pt denies NT. Denies giving way. Pt does have groin pain. Denies significant/consistent crepitus but would feel "locking" with running.   PERTINENT HISTORY: History of LBP   PAIN:  Are you having pain? Yes: NPRS scale: 3/10, 10/10 at worst Pain location: R hip/groin Pain description: throbbing,aching, dull Aggravating factors: stabbing pain into the groin and down the leg, sitting too long, standing, squatting, weighted exercise Relieving factors: Tylenol, advil, heat/ice, rest, laying on the back  PRECAUTIONS: None  WEIGHT BEARING RESTRICTIONS: No  FALLS:  Has patient fallen in last 6 months? No  LIVING ENVIRONMENT: Lives with: lives with their family Lives in: House/apartment Stairs: Yes  OCCUPATION: desk and travel jobs; owns own company  PLOF: Independent  PATIENT GOALS: return   NE OBJECTIVE:   DIAGNOSTIC FINDINGS:   None for hip in chart; reports torn labrum of the R hip  IMPRESSION: Lumbar spondylosis as outlined and most notably as follows.   At L5-S1, there is moderate disc degeneration with mild degenerative endplate edema. Disc bulge with superimposed shallow broad-based central disc protrusion. Mild facet arthrosis/ligamentum flavum hypertrophy. Mild/moderate bilateral subarticular narrowing with slight crowding of the descending S1 nerve roots. Central canal patent. Mild/moderate left neural foraminal narrowing.   At L4-L5,  small disc bulge. Mild facet arthrosis/ligamentum flavum hypertrophy. Mild left subarticular narrowing without nerve root impingement. Central canal and neural foramina patent.   At L3-L4, tiny left subarticular/foraminal disc protrusion at site of posterior annular fissure. Mild facet arthrosis. No significant canal or foraminal stenosis.  PATIENT SURVEYS:  FOTO 57 69 @ DC 11 pts MCII  COGNITION: Overall cognitive status: Within functional limits for tasks  assessed     SENSATION: WFL   MUSCLE LENGTH: Hamstrings: positive supine 90/90 Positive Ely's on R  POSTURE: No Significant postural limitations  PALPATION: TTP and hypertonicity of R VL, gluteals, deep hip rotators, and hip flexors  Lumbar ROM: limited with extension, rotation and SB; recreation of pain bilaterally; closing pattern reproduces pain  LOWER EXTREMITY ROM:  Active ROM Right eval Left eval  Hip flexion 100 p! 120  Hip extension 0 10  Hip abduction    Hip adduction    Hip internal rotation 15 p! 30   Hip external rotation 20 p! 45   (Blank rows = not tested)  LOWER EXTREMITY MMT:  MMT Right eval Left eval  Hip flexion 4/5 p! 4+/5  Hip extension 4/5 p! 4+/5  Hip abduction 4/5 p! 4+/5  Hip adduction 4/5 p! 4+/5  Hip internal rotation    Hip external rotation 4/5 p! 4+/5   (Blank rows = not tested)  LOWER EXTREMITY SPECIAL TESTS:  Hip special tests: Trendelenburg test: positive , Ely's test: positive , Hip scouring test: positive , Anterior hip impingement test: positive , Piriformis test: positive , and FABER positive  FUNCTIONAL TESTS:  Squat: apprehensive, decreased depth, offloading of R side, narrow, knee dominant vs hip dominant motion  GAIT: Distance walked: 55ft Assistive device utilized: None Level of assistance: Complete Independence Comments: decreased L step length  Stairs: reciprocal pattern without UE, hesitancy and lack of eccentric lowering control on R in terminal stance   TODAY'S TREATMENT:     5/17  S/L R lumbar UPA grade II-III and lumbar paraspinals STM Mulligan inf and lateral R hip mob grade III; mulligan manual traction 5 min  Shuttle leg press: single leg 75lbs 2x10 Edu about LE machine set up and usage to protect L/S   Exercises - Seated Quadratus Lumborum Stretch in Chair  - 2 x daily - 7 x weekly - 1 sets - 3 reps - 30 hold - Prone Quadriceps Stretch with Strap  - 2 x daily - 7 x weekly - 1 sets - 3 reps - 30  hold - Clamshell  - 2 x daily - 7 x weekly - 2-3 sets - 10 reps - Child's Pose Stretch  - 1 x daily - 7 x weekly - 3 sets - 10 reps - Seated Quadratus Lumborum Stretch in Chair  - 2 x daily - 7 x weekly - 1 sets - 3 reps - 30 hold  DATE: 5/9 -PROM R hip -manual HSS, adductor, and hip flexor stretch -STM R piriformis, proximal adductors, proximal quad/hip flexor, R psoas -Sidelying clam x20R -Prone hip extension with knee flexed (pillow under abdomen) 2x10R -Foam roll instruction -HEP review/update    DATE: 4/26  Pain with figure 4 stretch (held)    Exercises - Standing Hip Flexor Stretch  - 2 x daily - 7 x weekly - 1 sets - 2 reps - 30 hold - Bridge with Posterior Pelvic Tilt  - 1 x daily - 7 x weekly - 3 sets - 10 reps - Piriformis Mobilization on Foam Roll  - 1 x daily - 7 x weekly - 1 sets - 1 reps - 2-3 min hold - Prone Quadriceps Stretch with Strap  - 2 x daily - 7 x weekly - 1 sets - 3 reps - 30 hold    PATIENT EDUCATION:  Education details: MOI, diagnosis, prognosis, anatomy, exercise progression, DOMS expectations, muscle firing,  envelope of function, HEP, POC  Person educated: Patient Education method: Explanation, Demonstration, Tactile cues, Verbal cues, and Handouts Education comprehension: verbalized understanding, returned demonstration, verbal cues required, tactile cues required, and needs further education  HOME EXERCISE PROGRAM:  Access Code: [email or web address] URL: [email or web address] Date: 03/27/2023 Prepared by: Zebedee Iba  ASSESSMENT:  CLINICAL IMPRESSION: Pt presents to today's session with symptoms and report of L weakness that appear to be more lumbar in nature. Pt does continue to appear to have combined hip pain in conjunction with potential lumbar radiculopathy. Approach for treatment today used to address R quarter  mobility deficits and sensitivity. Pt did feel mild relief at end of session with lumbar stretching and light hip loading. Focus on flexion based lumbar protocol in addition to gradual LE strength. Revisit gym based exercise for LE as pt has access to gym and would like to return to LE exercise in addition to improving her pain. Pt would benefit from continued skilled therapy in order to reach goals and maximize functional lumbopelvic strength and ROM for return to PLOF and exercise.   OBJECTIVE IMPAIRMENTS: decreased activity tolerance, decreased balance, decreased endurance, decreased mobility, decreased ROM, decreased strength, hypomobility, increased muscle spasms, impaired flexibility, improper body mechanics, postural dysfunction, and pain.    ACTIVITY LIMITATIONS: lifting, squatting, locomotion level, and dressing   PARTICIPATION LIMITATIONS: interpersonal relationship, community activity, occupation, and exercise   PERSONAL FACTORS: Past/current experiences and Time since onset of injury/illness/exacerbation are also affecting patient's functional outcome.    REHAB POTENTIAL: Good   CLINICAL DECISION MAKING: Stable/uncomplicated   EVALUATION COMPLEXITY: Low     GOALS:     SHORT TERM GOALS: Target date: 05/08/2023   Pt will become independent with HEP in order to demonstrate synthesis of PT education.   Goal status: IN PROGRESS 5/9 2. Pt will score at least 11 pt increase on FOTO to demonstrate functional improvement in MCII and pt perceived function.     Goal status: INITIAL   3.  Pt will report at least 2 pt reduction on NPRS scale for pain in order to demonstrate functional improvement with household activity, self care, and ADL.    Goal status: INITIAL   LONG TERM GOALS: Target date: 06/19/2023     1. Pt  will become independent with final HEP in order to demonstrate synthesis of PT education.   Goal status: INITIAL   2.  Pt will be able to demonstrate full depth squat  without pain in order to demonstrate functional improvement in LE  function for return to exercise/training.  Goal status: INITIAL   3. Pt will be able to demonstrate full figure 4 and ER position without pain in order to demonstrate functional improvement in LE function for self-care and return to occupation/hobby activity.    Goal status: INITIAL   4.  Pt will be able to demonstrate ability to run/jog without pain in order to demonstrate functional improvement and tolerance to low level plyometric loading.    Goal status: INITIAL   5.  Pt will score >/= 69 on FOTO to demonstrate improvement in perceived R hip function.     Goal status: INITIAL       PLAN:   PT FREQUENCY: 1x/week   PT DURATION: 12 weeks (likely D/C to at 8 wks)   PLANNED INTERVENTIONS: Therapeutic exercises, Therapeutic activity, Neuromuscular re-education, Balance training, Gait training, Patient/Family education, Self Care, Joint mobilization, Joint manipulation, Stair training, Aquatic Therapy, Dry Needling, Electrical stimulation, Spinal manipulation, Spinal mobilization, Cryotherapy, Moist heat, scar mobilization, Splintting, Taping, Vasopneumatic device, Traction, Ultrasound, Ionotophoresis 4mg /ml Dexamethasone, Manual therapy, and Re-evaluation   PLAN FOR NEXT SESSION:  mulligan mobilizations, R L/S UPA, STM to R hip rotators, single leg stability, progressive glute strength; consider TPDN edu at next for PRN usage   Zebedee Iba, PT 04/17/2023, 10:20 AM

## 2023-04-23 ENCOUNTER — Ambulatory Visit (HOSPITAL_BASED_OUTPATIENT_CLINIC_OR_DEPARTMENT_OTHER): Payer: BC Managed Care – PPO | Admitting: Physical Therapy

## 2023-04-23 ENCOUNTER — Encounter (HOSPITAL_BASED_OUTPATIENT_CLINIC_OR_DEPARTMENT_OTHER): Payer: Self-pay | Admitting: Physical Therapy

## 2023-04-23 DIAGNOSIS — M6281 Muscle weakness (generalized): Secondary | ICD-10-CM

## 2023-04-23 DIAGNOSIS — R262 Difficulty in walking, not elsewhere classified: Secondary | ICD-10-CM

## 2023-04-23 DIAGNOSIS — M25551 Pain in right hip: Secondary | ICD-10-CM | POA: Diagnosis not present

## 2023-04-23 NOTE — Therapy (Signed)
OUTPATIENT PHYSICAL THERAPY LOWER EXTREMITY TREATMENT   Patient Name: Laura House MRN: [id number] DOB:1966-01-08, 57 y.o., female Today's Date: 04/23/2023  END OF SESSION:  PT End of Session - 04/23/23 0931     Visit Number 4    Number of Visits 15    Date for PT Re-Evaluation 06/25/23    Authorization Type BCBS    PT Start Time 0846    PT Stop Time 0928    PT Time Calculation (min) 42 min    Activity Tolerance Patient tolerated treatment well    Behavior During Therapy [facility] for tasks assessed/performed               History reviewed. No pertinent past medical history. History reviewed. No pertinent surgical history. There are no problems to display for this patient.   PCP: N/A  REFERRING PROVIDER:   Huel Cote, MD    REFERRING DIAG:  [phone] (ICD-10-CM) - Tendinopathy of gluteus medius      THERAPY DIAG:  Pain in right hip  Muscle weakness (generalized)  Difficulty walking  Rationale for Evaluation and Treatment: Rehabilitation  ONSET DATE: 2023 Dec.   SUBJECTIVE:   SUBJECTIVE STATEMENT: Pt states the pain is similar frequency. She is walking consistently. She did not do the leg press from last session due to apprehension.   EVAL: Pt states she is here today for the R hip pain. Pt was previously seen for PT for treatment of her back but nothing specifically for the hip. Pt had insidious onset of pain but could be related to dancing at her son's wedding. Pt states she was an avid runner but has not been able to run for the past 2 years. Pt has tried injections with some mild relief but most recent injection did not last. Pt reports pain into there R leg/thigh. Pt states it was debilitating pain where she could not walk. Pt works out with a Systems analyst currently- [employer] with UE only. She is worried and afraid to work out the LE. Pt is not able to lift with ADL and house chores. Laying on stomach makes back hurt. Pt does  not currently do stretching for the hip due to fear. Pt denies NT. Denies giving way. Pt does have groin pain. Denies significant/consistent crepitus but would feel "locking" with running.   PERTINENT HISTORY: History of LBP   PAIN:  Are you having pain? No: NPRS scale: 0/10, 10/10 at worst Pain location: R hip/groin Pain description: throbbing,aching, dull Aggravating factors: stabbing pain into the groin and down the leg, sitting too long, standing, squatting, weighted exercise Relieving factors: Tylenol, advil, heat/ice, rest, laying on the back  PRECAUTIONS: None  WEIGHT BEARING RESTRICTIONS: No  FALLS:  Has patient fallen in last 6 months? No  LIVING ENVIRONMENT: Lives with: lives with their family Lives in: House/apartment Stairs: Yes  OCCUPATION: desk and travel jobs; owns own company  PLOF: Independent  PATIENT GOALS: return   NE OBJECTIVE:   DIAGNOSTIC FINDINGS:   None for hip in chart; reports torn labrum of the R hip  IMPRESSION: Lumbar spondylosis as outlined and most notably as follows.   At L5-S1, there is moderate disc degeneration with mild degenerative endplate edema. Disc bulge with superimposed shallow broad-based central disc protrusion. Mild facet arthrosis/ligamentum flavum hypertrophy. Mild/moderate bilateral subarticular narrowing with slight crowding of the descending S1 nerve roots. Central canal patent. Mild/moderate left neural foraminal narrowing.   At L4-L5, small disc bulge. Mild facet arthrosis/ligamentum flavum hypertrophy.  Mild left subarticular narrowing without nerve root impingement. Central canal and neural foramina patent.   At L3-L4, tiny left subarticular/foraminal disc protrusion at site of posterior annular fissure. Mild facet arthrosis. No significant canal or foraminal stenosis.  PATIENT SURVEYS:  FOTO 57 69 @ DC 11 pts MCII  COGNITION: Overall cognitive status: Within functional limits for tasks  assessed     SENSATION: WFL   MUSCLE LENGTH: Hamstrings: positive supine 90/90 Positive Ely's on R  POSTURE: No Significant postural limitations  PALPATION: TTP and hypertonicity of R VL, gluteals, deep hip rotators, and hip flexors  Lumbar ROM: limited with extension, rotation and SB; recreation of pain bilaterally; closing pattern reproduces pain  LOWER EXTREMITY ROM:  Active ROM Right eval Left eval  Hip flexion 100 p! 120  Hip extension 0 10  Hip abduction    Hip adduction    Hip internal rotation 15 p! 30   Hip external rotation 20 p! 45   (Blank rows = not tested)  LOWER EXTREMITY MMT:  MMT Right eval Left eval  Hip flexion 4/5 p! 4+/5  Hip extension 4/5 p! 4+/5  Hip abduction 4/5 p! 4+/5  Hip adduction 4/5 p! 4+/5  Hip internal rotation    Hip external rotation 4/5 p! 4+/5   (Blank rows = not tested)  LOWER EXTREMITY SPECIAL TESTS:  Hip special tests: Trendelenburg test: positive , Ely's test: positive , Hip scouring test: positive , Anterior hip impingement test: positive , Piriformis test: positive , and FABER positive  FUNCTIONAL TESTS:  Squat: apprehensive, decreased depth, offloading of R side, narrow, knee dominant vs hip dominant motion  GAIT: Distance walked: 19ft Assistive device utilized: None Level of assistance: Complete Independence Comments: decreased L step length  Stairs: reciprocal pattern without UE, hesitancy and lack of eccentric lowering control on R in terminal stance   TODAY'S TREATMENT:     5/23   S/L R and L lumbar UPA grade II-III and lumbar paraspinals STM  Mulligan inf and lateral R hip mob grade III; mulligan manual traction 5 min   Jefferson curl 3x5 5lbs  Weighted sidebend 5lbs  Lateral step down 6" 3x8  5/17  S/L R lumbar UPA grade II-III and lumbar paraspinals STM Mulligan inf and lateral R hip mob grade III; mulligan manual traction 5 min  Shuttle leg press: single leg 75lbs 2x10 Edu about LE  machine set up and usage to protect L/S   Exercises - Seated Quadratus Lumborum Stretch in Chair  - 2 x daily - 7 x weekly - 1 sets - 3 reps - 30 hold - Prone Quadriceps Stretch with Strap  - 2 x daily - 7 x weekly - 1 sets - 3 reps - 30 hold - Clamshell  - 2 x daily - 7 x weekly - 2-3 sets - 10 reps - Child's Pose Stretch  - 1 x daily - 7 x weekly - 3 sets - 10 reps - Seated Quadratus Lumborum Stretch in Chair  - 2 x daily - 7 x weekly - 1 sets - 3 reps - 30 hold  DATE: 5/9 -PROM R hip -manual HSS, adductor, and hip flexor stretch -STM R piriformis, proximal adductors, proximal quad/hip flexor, R psoas -Sidelying clam x20R -Prone hip extension with knee flexed (pillow under abdomen) 2x10R -Foam roll instruction -HEP review/update    DATE: 4/26  Pain with figure 4 stretch (held)    Exercises - Standing Hip Flexor Stretch  - 2 x daily - 7 x weekly - 1 sets - 2 reps - 30 hold - Bridge with Posterior Pelvic Tilt  - 1 x daily - 7 x weekly - 3 sets - 10 reps - Piriformis Mobilization on Foam Roll  - 1 x daily - 7 x weekly - 1 sets - 1 reps - 2-3 min hold - Prone Quadriceps Stretch with Strap  - 2 x daily - 7 x weekly - 1 sets - 3 reps - 30 hold    PATIENT EDUCATION:  Education details: MOI, diagnosis, prognosis, anatomy, exercise progression, DOMS expectations, muscle firing,  envelope of function, HEP, POC  Person educated: Patient Education method: Explanation, Demonstration, Tactile cues, Verbal cues, and Handouts Education comprehension: verbalized understanding, returned demonstration, verbal cues required, tactile cues required, and needs further education  HOME EXERCISE PROGRAM:  Access Code: [email or web address] URL: [email or web address] Date: 03/27/2023 Prepared by: Zebedee Iba  ASSESSMENT:  CLINICAL IMPRESSION: Pt presents without pain  today during session and did not have exacerbation of pain during session. Pt responded well again to repeat  of manual therapy and progression of lumbopelvic mobility and strength. Plan to continue gym based exercise for LE as pt has access to gym and would like to return to LE exercise in addition to improving her pain. Consider modified planks, revisit leg pressing, weighted carry, and box goblet squat type motions. Pt would benefit from continued skilled therapy in order to reach goals and maximize functional lumbopelvic strength and ROM for return to PLOF and exercise.   OBJECTIVE IMPAIRMENTS: decreased activity tolerance, decreased balance, decreased endurance, decreased mobility, decreased ROM, decreased strength, hypomobility, increased muscle spasms, impaired flexibility, improper body mechanics, postural dysfunction, and pain.    ACTIVITY LIMITATIONS: lifting, squatting, locomotion level, and dressing   PARTICIPATION LIMITATIONS: interpersonal relationship, community activity, occupation, and exercise   PERSONAL FACTORS: Past/current experiences and Time since onset of injury/illness/exacerbation are also affecting patient's functional outcome.    REHAB POTENTIAL: Good   CLINICAL DECISION MAKING: Stable/uncomplicated   EVALUATION COMPLEXITY: Low     GOALS:     SHORT TERM GOALS: Target date: 05/08/2023   Pt will become independent with HEP in order to demonstrate synthesis of PT education.   Goal status: IN PROGRESS 5/9 2. Pt will score at least 11 pt increase on FOTO to demonstrate functional improvement in MCII and pt perceived function.     Goal status: INITIAL   3.  Pt will report at least 2 pt reduction on NPRS scale for pain in order to demonstrate functional improvement with household activity, self care, and ADL.    Goal status: INITIAL   LONG TERM GOALS: Target date: 06/19/2023     1. Pt  will become independent with final HEP in order to demonstrate synthesis of PT  education.   Goal status: INITIAL   2.  Pt will be able to demonstrate full depth squat without pain in order to demonstrate functional improvement in LE function for return to exercise/training.  Goal status: INITIAL   3. Pt will be able to demonstrate full figure 4 and ER position without pain in order  to demonstrate functional improvement in LE function for self-care and return to occupation/hobby activity.    Goal status: INITIAL   4.  Pt will be able to demonstrate ability to run/jog without pain in order to demonstrate functional improvement and tolerance to low level plyometric loading.    Goal status: INITIAL   5.  Pt will score >/= 69 on FOTO to demonstrate improvement in perceived R hip function.     Goal status: INITIAL       PLAN:   PT FREQUENCY: 1x/week   PT DURATION: 12 weeks (likely D/C to at 8 wks)   PLANNED INTERVENTIONS: Therapeutic exercises, Therapeutic activity, Neuromuscular re-education, Balance training, Gait training, Patient/Family education, Self Care, Joint mobilization, Joint manipulation, Stair training, Aquatic Therapy, Dry Needling, Electrical stimulation, Spinal manipulation, Spinal mobilization, Cryotherapy, Moist heat, scar mobilization, Splintting, Taping, Vasopneumatic device, Traction, Ultrasound, Ionotophoresis 4mg /ml Dexamethasone, Manual therapy, and Re-evaluation   PLAN FOR NEXT SESSION:  mulligan mobilizations, R L/S UPA, STM to R hip rotators, single leg stability, progressive glute strength; consider TPDN edu at next for PRN usage   Zebedee Iba, PT 04/23/2023, 9:33 AM

## 2023-04-30 ENCOUNTER — Ambulatory Visit (HOSPITAL_BASED_OUTPATIENT_CLINIC_OR_DEPARTMENT_OTHER): Payer: BC Managed Care – PPO

## 2023-04-30 ENCOUNTER — Encounter (HOSPITAL_BASED_OUTPATIENT_CLINIC_OR_DEPARTMENT_OTHER): Payer: Self-pay

## 2023-04-30 DIAGNOSIS — R262 Difficulty in walking, not elsewhere classified: Secondary | ICD-10-CM

## 2023-04-30 DIAGNOSIS — M6281 Muscle weakness (generalized): Secondary | ICD-10-CM

## 2023-04-30 DIAGNOSIS — M25551 Pain in right hip: Secondary | ICD-10-CM | POA: Diagnosis not present

## 2023-04-30 NOTE — Therapy (Addendum)
 OUTPATIENT PHYSICAL THERAPY LOWER EXTREMITY TREATMENT PHYSICAL THERAPY DISCHARGE SUMMARY  Visits from Start of Care: 5  Current functional level related to goals / functional outcomes: unknown   Remaining deficits: unknown   Education / Equipment: HEP   Patient agrees to discharge. Patient goals were partially met. Patient is being discharged due to not returning since the last visit.  Addend Corrie Dandy Tomma Lightning) Ziemba MPT 01/22/24 10:25 AM [facility] [address] [place], [place], [address] Phone: [phone]   Fax:  [phone]    Patient Name: Laura House MRN: [id number] DOB:[birth year], 57 y.o., female Today's Date: 04/30/2023  END OF SESSION:  PT End of Session - 04/30/23 0933     Visit Number 5    Number of Visits 15    Date for PT Re-Evaluation 06/25/23    Authorization Type BCBS    PT Start Time 0933    PT Stop Time 1015    PT Time Calculation (min) 42 min    Activity Tolerance Patient tolerated treatment well    Behavior During Therapy [facility] for tasks assessed/performed                History reviewed. No pertinent past medical history. History reviewed. No pertinent surgical history. There are no problems to display for this patient.   PCP: N/A  REFERRING PROVIDER:   Huel Cote, MD    REFERRING DIAG:  [phone] (ICD-10-CM) - Tendinopathy of gluteus medius      THERAPY DIAG:  Pain in right hip  Muscle weakness (generalized)  Difficulty walking  Rationale for Evaluation and Treatment: Rehabilitation  ONSET DATE: 2023 Dec.   SUBJECTIVE:   SUBJECTIVE STATEMENT: Pt reports she tried stair climber at gym for 7 minutes at slow pace before she felt pain and throbbing in front of hip. Also walked for 30 minutes yesterday. "I think I aggravated it, it's sore." 3/10 pain level   EVAL: Pt states she is here today for the R hip pain. Pt was previously seen for PT for treatment  of her back but nothing specifically for the hip. Pt had insidious onset of pain but could be related to dancing at her son's wedding. Pt states she was an avid runner but has not been able to run for the past 2 years. Pt has tried injections with some mild relief but most recent injection did not last. Pt reports pain into there R leg/thigh. Pt states it was debilitating pain where she could not walk. Pt works out with a Systems analyst currently- [employer] with UE only. She is worried and afraid to work out the LE. Pt is not able to lift with ADL and house chores. Laying on stomach makes back hurt. Pt does not currently do stretching for the hip due to fear. Pt denies NT. Denies giving way. Pt does have groin pain. Denies significant/consistent crepitus but would feel "locking" with running.   PERTINENT HISTORY: History of LBP   PAIN:  Are you having pain? Yes: NPRS scale: 3/10, 10/10 at worst Pain location: R hip/groin Pain description: throbbing,aching, dull Aggravating factors: stabbing pain into the groin and down the leg, sitting too long, standing, squatting, weighted exercise Relieving factors: Tylenol, advil, heat/ice, rest, laying on the back  PRECAUTIONS: None  WEIGHT BEARING RESTRICTIONS: No  FALLS:  Has patient fallen in last 6 months? No  LIVING ENVIRONMENT: Lives with: lives with their family Lives in: House/apartment Stairs: Yes  OCCUPATION: desk and travel jobs; owns  own company  PLOF: Independent  PATIENT GOALS: return   NE OBJECTIVE:   DIAGNOSTIC FINDINGS:   None for hip in chart; reports torn labrum of the R hip  IMPRESSION: Lumbar spondylosis as outlined and most notably as follows.   At L5-S1, there is moderate disc degeneration with mild degenerative endplate edema. Disc bulge with superimposed shallow broad-based central disc protrusion. Mild facet arthrosis/ligamentum flavum hypertrophy. Mild/moderate bilateral subarticular  narrowing with slight crowding of the descending S1 nerve roots. Central canal patent. Mild/moderate left neural foraminal narrowing.   At L4-L5, small disc bulge. Mild facet arthrosis/ligamentum flavum hypertrophy. Mild left subarticular narrowing without nerve root impingement. Central canal and neural foramina patent.   At L3-L4, tiny left subarticular/foraminal disc protrusion at site of posterior annular fissure. Mild facet arthrosis. No significant canal or foraminal stenosis.  PATIENT SURVEYS:  FOTO 57 69 @ DC 11 pts MCII  COGNITION: Overall cognitive status: Within functional limits for tasks assessed     SENSATION: WFL   MUSCLE LENGTH: Hamstrings: positive supine 90/90 Positive Ely's on R  POSTURE: No Significant postural limitations  PALPATION: TTP and hypertonicity of R VL, gluteals, deep hip rotators, and hip flexors  Lumbar ROM: limited with extension, rotation and SB; recreation of pain bilaterally; closing pattern reproduces pain  LOWER EXTREMITY ROM:  Active ROM Right eval Left eval  Hip flexion 100 p! 120  Hip extension 0 10  Hip abduction    Hip adduction    Hip internal rotation 15 p! 30   Hip external rotation 20 p! 45   (Blank rows = not tested)  LOWER EXTREMITY MMT:  MMT Right eval Left eval  Hip flexion 4/5 p! 4+/5  Hip extension 4/5 p! 4+/5  Hip abduction 4/5 p! 4+/5  Hip adduction 4/5 p! 4+/5  Hip internal rotation    Hip external rotation 4/5 p! 4+/5   (Blank rows = not tested)  LOWER EXTREMITY SPECIAL TESTS:  Hip special tests: Trendelenburg test: positive , Ely's test: positive , Hip scouring test: positive , Anterior hip impingement test: positive , Piriformis test: positive , and FABER positive  FUNCTIONAL TESTS:  Squat: apprehensive, decreased depth, offloading of R side, narrow, knee dominant vs hip dominant motion  GAIT: Distance walked: 85ft Assistive device utilized: None Level of assistance: Complete  Independence Comments: decreased L step length  Stairs: reciprocal pattern without UE, hesitancy and lack of eccentric lowering control on R in terminal stance   TODAY'S TREATMENT:     5/30 STM to R hip flexors, adductors, glutes BKFO with GTB 2x10bil Static Lunge with glute activation Step up with glute activation and opposite march Hip flexor stretch instruction- standing, using step, and 1/2 kneeling Side stepping with partial squat (GTB at ankles) x2 laps Glute iso at wall with ball 5" x10   5/23   S/L R and L lumbar UPA grade II-III and lumbar paraspinals STM  Mulligan inf and lateral R hip mob grade III; mulligan manual traction 5 min   Jefferson curl 3x5 5lbs  Weighted sidebend 5lbs  Lateral step down 6" 3x8  5/17  S/L R lumbar UPA grade II-III and lumbar paraspinals STM Mulligan inf and lateral R hip mob grade III; mulligan manual traction 5 min  Shuttle leg press: single leg 75lbs 2x10 Edu about LE machine set up and usage to protect L/S   Exercises - Seated Quadratus Lumborum Stretch in Chair  - 2 x daily - 7 x weekly - 1 sets - 3  reps - 30 hold - Prone Quadriceps Stretch with Strap  - 2 x daily - 7 x weekly - 1 sets - 3 reps - 30 hold - Clamshell  - 2 x daily - 7 x weekly - 2-3 sets - 10 reps - Child's Pose Stretch  - 1 x daily - 7 x weekly - 3 sets - 10 reps - Seated Quadratus Lumborum Stretch in Chair  - 2 x daily - 7 x weekly - 1 sets - 3 reps - 30 hold                                                                                                                             DATE: 5/9 -PROM R hip -manual HSS, adductor, and hip flexor stretch -STM R piriformis, proximal adductors, proximal quad/hip flexor, R psoas -Sidelying clam x20R -Prone hip extension with knee flexed (pillow under abdomen) 2x10R -Foam roll instruction -HEP review/update    DATE: 4/26  Pain with figure 4 stretch (held)    Exercises - Standing Hip Flexor Stretch  - 2 x  daily - 7 x weekly - 1 sets - 2 reps - 30 hold - Bridge with Posterior Pelvic Tilt  - 1 x daily - 7 x weekly - 3 sets - 10 reps - Piriformis Mobilization on Foam Roll  - 1 x daily - 7 x weekly - 1 sets - 1 reps - 2-3 min hold - Prone Quadriceps Stretch with Strap  - 2 x daily - 7 x weekly - 1 sets - 3 reps - 30 hold    PATIENT EDUCATION:  Education details: MOI, diagnosis, prognosis, anatomy, exercise progression, DOMS expectations, muscle firing,  envelope of function, HEP, POC  Person educated: Patient Education method: Explanation, Demonstration, Tactile cues, Verbal cues, and Handouts Education comprehension: verbalized understanding, returned demonstration, verbal cues required, tactile cues required, and needs further education  HOME EXERCISE PROGRAM:  Access Code: [email or web address] URL: [email or web address] Date: 03/27/2023 Prepared by: Zebedee Iba  ASSESSMENT:  CLINICAL IMPRESSION: Pt with increased tenderness and tightness in hip musculature today so spent time on manual techniques to address this. Instructed pt in hip flexor stretching to improve this at home. Advised pt avoid stair climber for the time being. Worked on cueing for glute activation with exercises. Educated pt on becoming more aware of glute engagement with walking and ADLS.    OBJECTIVE IMPAIRMENTS: decreased activity tolerance, decreased balance, decreased endurance, decreased mobility, decreased ROM, decreased strength, hypomobility, increased muscle spasms, impaired flexibility, improper body mechanics, postural dysfunction, and pain.    ACTIVITY LIMITATIONS: lifting, squatting, locomotion level, and dressing   PARTICIPATION LIMITATIONS: interpersonal relationship, community activity, occupation, and exercise   PERSONAL FACTORS: Past/current experiences and Time since onset of injury/illness/exacerbation are also affecting patient's functional outcome.    REHAB POTENTIAL: Good   CLINICAL DECISION  MAKING: Stable/uncomplicated   EVALUATION COMPLEXITY: Low     GOALS:     SHORT TERM GOALS: Target  date: 05/08/2023   Pt will become independent with HEP in order to demonstrate synthesis of PT education.   Goal status: IN PROGRESS 5/9 2. Pt will score at least 11 pt increase on FOTO to demonstrate functional improvement in MCII and pt perceived function.     Goal status: INITIAL   3.  Pt will report at least 2 pt reduction on NPRS scale for pain in order to demonstrate functional improvement with household activity, self care, and ADL.    Goal status: INITIAL   LONG TERM GOALS: Target date: 06/19/2023     1. Pt  will become independent with final HEP in order to demonstrate synthesis of PT education.   Goal status: INITIAL   2.  Pt will be able to demonstrate full depth squat without pain in order to demonstrate functional improvement in LE function for return to exercise/training.  Goal status: INITIAL   3. Pt will be able to demonstrate full figure 4 and ER position without pain in order to demonstrate functional improvement in LE function for self-care and return to occupation/hobby activity.    Goal status: INITIAL   4.  Pt will be able to demonstrate ability to run/jog without pain in order to demonstrate functional improvement and tolerance to low level plyometric loading.    Goal status: INITIAL   5.  Pt will score >/= 69 on FOTO to demonstrate improvement in perceived R hip function.     Goal status: INITIAL       PLAN:   PT FREQUENCY: 1x/week   PT DURATION: 12 weeks (likely D/C to at 8 wks)   PLANNED INTERVENTIONS: Therapeutic exercises, Therapeutic activity, Neuromuscular re-education, Balance training, Gait training, Patient/Family education, Self Care, Joint mobilization, Joint manipulation, Stair training, Aquatic Therapy, Dry Needling, Electrical stimulation, Spinal manipulation, Spinal mobilization, Cryotherapy, Moist heat, scar mobilization, Splintting,  Taping, Vasopneumatic device, Traction, Ultrasound, Ionotophoresis 4mg /ml Dexamethasone, Manual therapy, and Re-evaluation   PLAN FOR NEXT SESSION:  mulligan mobilizations, R L/S UPA, STM to R hip rotators, single leg stability, progressive glute strength; consider TPDN edu at next for PRN usage   Donnel Saxon Bryna Razavi, PTA 04/30/2023, 12:12 PM

## 2023-05-07 ENCOUNTER — Ambulatory Visit (HOSPITAL_BASED_OUTPATIENT_CLINIC_OR_DEPARTMENT_OTHER): Payer: BC Managed Care – PPO | Admitting: Physical Therapy

## 2023-05-14 ENCOUNTER — Encounter (HOSPITAL_BASED_OUTPATIENT_CLINIC_OR_DEPARTMENT_OTHER): Payer: BC Managed Care – PPO | Admitting: Physical Therapy

## 2024-12-06 ENCOUNTER — Ambulatory Visit (INDEPENDENT_AMBULATORY_CARE_PROVIDER_SITE_OTHER)

## 2024-12-06 ENCOUNTER — Ambulatory Visit (HOSPITAL_BASED_OUTPATIENT_CLINIC_OR_DEPARTMENT_OTHER): Admitting: Physician Assistant

## 2024-12-06 ENCOUNTER — Ambulatory Visit (INDEPENDENT_AMBULATORY_CARE_PROVIDER_SITE_OTHER): Admitting: Student

## 2024-12-06 DIAGNOSIS — M25561 Pain in right knee: Secondary | ICD-10-CM

## 2024-12-06 NOTE — Progress Notes (Signed)
 "                                Chief Complaint: Right knee pain    Discussed the use of AI scribe software for clinical note transcription with the patient, who gave verbal consent to proceed.  History of Present Illness Laura House is a 59 year old female with prior right hip labral tear who presents with acute right knee pain and mechanical symptoms following a fall.  One week ago, while standing in her kitchen, she took two steps backward and felt a sudden snapping sensation in the posterior right leg, immediately followed by a fall onto the right knee when the leg gave out. Since then she has had persistent pain mainly in the posterior right knee with soreness in the popliteal fossa and lateral aspect. She cannot fully flex the knee and has pain at a specific point in flexion with radiation to the posterior knee. She can nearly but not completely extend the knee. She has intermittent popping in the right knee while walking. She denies prior right knee problems, locking, catching, or significant swelling. She notes localized tenderness in the posterior and lateral knee, which she attributes to the fall. Pain is tolerable at rest but worsens with flexion. She has treated with elevation, ice, a knee sleeve, and alternating Tylenol Arthritis at night and Advil twice daily, with only partial relief. She is usually very active with daily exercise, which is now limited by knee pain.   Surgical History:   None  PMH/PSH/Family History/Social History/Meds/Allergies:   No past medical history on file. No past surgical history on file. Social History   Socioeconomic History   Marital status: Unknown    Spouse name: Not on file   Number of children: Not on file   Years of education: Not on file   Highest education level: Not on file  Occupational History   Not on file  Tobacco Use   Smoking status: Not on file   Smokeless tobacco: Not on file  Substance and Sexual Activity   Alcohol  use: Not on file   Drug use: Not on file   Sexual activity: Not on file  Other Topics Concern   Not on file  Social History Narrative   Not on file   Social Drivers of Health   Tobacco Use: Medium Risk (02/04/2024)   [facility]   Patient History    Passive Exposure: Not on file    Smokeless Tobacco Use: Never    Smoking Tobacco Use: Former  Programmer, Applications: Not on [employer] Insecurity: Not on file  Transportation Needs: Not on file  Physical Activity: Not on file  Stress: Not on file  Social Connections: Not on file  Depression (PHQ2-9): Not on file  Alcohol Screen: Not on file  Housing: Not on file  Utilities: Not on file  Health Literacy: Not on file   No family history on file. Allergies[1] No current outpatient medications on file.   No current facility-administered medications for this visit.   No results found.  Review of Systems:   A ROS was performed including pertinent positives and negatives as documented in the HPI.  Physical Exam :   Constitutional: NAD and appears stated age Neurological: Alert and oriented Psych: Appropriate affect and cooperative There were no vitals taken for this visit.   Comprehensive Musculoskeletal Exam:    Exam  of the right knee demonstrates tenderness palpation over the medial joint line.  Mild effusion without erythema or warmth.  Active range of motion from 5 to 110 degrees without crepitus.  No laxity or pain with varus or valgus stress.  Positive McMurray.  5/5 strength with knee flexion and extension.  Imaging:   Xray (right knee 4 views): Negative for bony abnormality with well-maintained joint spacing   I personally reviewed and interpreted the radiographs.      Assessment & Plan Right knee pain, possible meniscus tear Acute right knee pain with mechanical symptoms suggests a meniscus injury, with concern for a root tear. Muscle strain is considered but meniscal pathology is more  probable given her symptoms. Radiographs showed no fracture or degenerative changes. An MRI is needed for a definitive diagnosis due to high risk of progressive arthritis if her root tear is present.  She is advised to alternate acetaminophen and ibuprofen every four hours as needed for pain, with dosing instructions provided. Meloxicam is an option if the current regimen is inadequate; she should notify if escalation is required. A corticosteroid injection was reviewed for severe pain while awaiting MRI results. Use of a knee sleeve for swelling control is advised as tolerated. She should avoid lower extremity exercise; upper body and weight training are permitted. She is instructed to notify the office once the MRI is scheduled for prompt follow-up.     I personally saw and evaluated the patient, and participated in the management and treatment plan.  Leonce Reveal, PA-C Orthopedics      [1] Not on File  "

## 2024-12-11 ENCOUNTER — Other Ambulatory Visit

## 2024-12-11 DIAGNOSIS — M25561 Pain in right knee: Secondary | ICD-10-CM | POA: Diagnosis not present

## 2024-12-14 ENCOUNTER — Ambulatory Visit (INDEPENDENT_AMBULATORY_CARE_PROVIDER_SITE_OTHER): Admitting: Student

## 2024-12-14 DIAGNOSIS — S83511A Sprain of anterior cruciate ligament of right knee, initial encounter: Secondary | ICD-10-CM

## 2024-12-14 NOTE — Progress Notes (Signed)
 "                                Chief Complaint: Right knee pain     History of Present Illness  12/14/24: Patient presents today for MRI review of the right knee.  She has been able to continue weightbearing although notes 2 episodes of her knee buckling.  She is still having pain on both the anterior and posterior aspects of the knee as well as swelling.   12/06/24: Laura House is a 59 year old female with prior right hip labral tear who presents with acute right knee pain and mechanical symptoms following a fall. One week ago, while standing in her kitchen, she took two steps backward and felt a sudden snapping sensation in the posterior right leg, immediately followed by a fall onto the right knee when the leg gave out. Since then she has had persistent pain mainly in the posterior right knee with soreness in the popliteal fossa and lateral aspect. She cannot fully flex the knee and has pain at a specific point in flexion with radiation to the posterior knee. She can nearly but not completely extend the knee. She has intermittent popping in the right knee while walking. She denies prior right knee problems, locking, catching, or significant swelling. She notes localized tenderness in the posterior and lateral knee, which she attributes to the fall. Pain is tolerable at rest but worsens with flexion. She has treated with elevation, ice, a knee sleeve, and alternating Tylenol  Arthritis at night and Advil  twice daily, with only partial relief. She is usually very active with daily exercise, which is now limited by knee pain.   Surgical History:   None  PMH/PSH/Family History/Social History/Meds/Allergies:   No past medical history on file. No past surgical history on file. Social History   Socioeconomic History   Marital status: Unknown    Spouse name: Not on file   Number of children: Not on file   Years of education: Not on file   Highest education level: Not on file   Occupational History   Not on file  Tobacco Use   Smoking status: Not on file   Smokeless tobacco: Not on file  Substance and Sexual Activity   Alcohol use: Not on file   Drug use: Not on file   Sexual activity: Not on file  Other Topics Concern   Not on file  Social History Narrative   Not on file   Social Drivers of Health   Tobacco Use: Medium Risk (02/04/2024)   Received from [facility]   Patient History    Passive Exposure: Not on file    Smokeless Tobacco Use: Never    Smoking Tobacco Use: Former  Programmer, Applications: Not on [employer] Insecurity: Not on file  Transportation Needs: Not on file  Physical Activity: Not on file  Stress: Not on file  Social Connections: Not on file  Depression (PHQ2-9): Not on file  Alcohol Screen: Not on file  Housing: Not on file  Utilities: Not on file  Health Literacy: Not on file   No family history on file. Allergies[1] No current outpatient medications on file.   No current facility-administered medications for this visit.   No results found.  Review of Systems:   A ROS was performed including pertinent positives and negatives as documented in the HPI.  Physical Exam :   Constitutional: NAD  and appears stated age Neurological: Alert and oriented Psych: Appropriate affect and cooperative There were no vitals taken for this visit.   Comprehensive Musculoskeletal Exam:    Exam of the right knee demonstrates tenderness palpation over the medial joint line.  Mild effusion without erythema or warmth.  Active range of motion from 5 to 110 degrees without crepitus.  No laxity or pain with varus or valgus stress.  Positive Lachman.  5/5 strength with knee flexion and extension.  Imaging:   MRI right knee: Isolated complete tear of the ACL with pivot shift contusion pattern.  Mild MCL sprain.  No evidence of meniscal injury.  I personally reviewed and interpreted the radiographs.      Assessment & Plan Right  knee ACL tear Patient sustained a right knee injury 2 weeks ago while taking a step backward inside of her home, causing her knee to buckle.  Recent MRI was reviewed today with patient which does unfortunately show a complete tear of the ACL.  We discussed in depth the limitations of ongoing knee instability in an ACL deficient knee as well as the likelihood to advance osteoarthritis in the future.  She does like to stay active therefore this would be a concern moving forward.  Patient was also able to discuss this today with Dr. Genelle, who does recommend an ACL reconstruction with quad tendon allograft.  Surgical procedure and expected recovery timeline were reviewed.  Patient does want to pursue scheduling surgery, however would like to first discuss this with her husband and family before proceeding further.  Will have her call or send a MyChart message with confirmation to move forward with surgery, and at that time I can send in postop medications and provide a hinged brace for postop.  All questions addressed at this time.     I personally saw and evaluated the patient, and participated in the management and treatment plan.  Leonce Reveal, PA-C Orthopedics     [1] Not on File  "

## 2024-12-15 ENCOUNTER — Other Ambulatory Visit (HOSPITAL_BASED_OUTPATIENT_CLINIC_OR_DEPARTMENT_OTHER): Payer: Self-pay

## 2024-12-15 ENCOUNTER — Telehealth: Payer: Self-pay | Admitting: Orthopaedic Surgery

## 2024-12-15 ENCOUNTER — Encounter (HOSPITAL_BASED_OUTPATIENT_CLINIC_OR_DEPARTMENT_OTHER): Payer: Self-pay

## 2024-12-15 ENCOUNTER — Other Ambulatory Visit (HOSPITAL_BASED_OUTPATIENT_CLINIC_OR_DEPARTMENT_OTHER): Payer: Self-pay | Admitting: Student

## 2024-12-15 MED ORDER — ASPIRIN 325 MG PO TBEC
325.0000 mg | DELAYED_RELEASE_TABLET | Freq: Every day | ORAL | 0 refills | Status: AC
  Filled 2024-12-15: qty 14, 14d supply, fill #0

## 2024-12-15 MED ORDER — IBUPROFEN 800 MG PO TABS
800.0000 mg | ORAL_TABLET | Freq: Three times a day (TID) | ORAL | 0 refills | Status: AC | PRN
  Filled 2024-12-15: qty 30, 10d supply, fill #0

## 2024-12-15 MED ORDER — OXYCODONE HCL 5 MG PO TABS
5.0000 mg | ORAL_TABLET | ORAL | 0 refills | Status: AC | PRN
  Filled 2024-12-15: qty 20, 4d supply, fill #0

## 2024-12-15 MED ORDER — ACETAMINOPHEN 500 MG PO TABS
500.0000 mg | ORAL_TABLET | Freq: Four times a day (QID) | ORAL | 0 refills | Status: AC | PRN
  Filled 2024-12-15: qty 30, 8d supply, fill #0

## 2024-12-15 MED ORDER — ONDANSETRON HCL 4 MG PO TABS
4.0000 mg | ORAL_TABLET | Freq: Three times a day (TID) | ORAL | 0 refills | Status: AC | PRN
  Filled 2024-12-15: qty 20, 7d supply, fill #0

## 2024-12-15 NOTE — Telephone Encounter (Signed)
 Pt called stating she would like to move forward for surgery. Pt number is [phone]. Pt also need update about brace that was mentioned at appt. Please call pt at [phone].

## 2024-12-16 ENCOUNTER — Other Ambulatory Visit (HOSPITAL_BASED_OUTPATIENT_CLINIC_OR_DEPARTMENT_OTHER): Payer: Self-pay | Admitting: Orthopaedic Surgery

## 2024-12-16 DIAGNOSIS — S83511A Sprain of anterior cruciate ligament of right knee, initial encounter: Secondary | ICD-10-CM

## 2024-12-16 NOTE — Telephone Encounter (Signed)
 Pt is aware.

## 2024-12-23 ENCOUNTER — Ambulatory Visit (HOSPITAL_BASED_OUTPATIENT_CLINIC_OR_DEPARTMENT_OTHER): Payer: Self-pay | Admitting: Orthopaedic Surgery

## 2024-12-23 ENCOUNTER — Ambulatory Visit (HOSPITAL_BASED_OUTPATIENT_CLINIC_OR_DEPARTMENT_OTHER): Admitting: Orthopaedic Surgery

## 2024-12-23 DIAGNOSIS — S83511A Sprain of anterior cruciate ligament of right knee, initial encounter: Secondary | ICD-10-CM

## 2024-12-23 NOTE — Progress Notes (Signed)
 "                   Chief Complaint: Right knee pain        History of Present Illness   12/23/2024: Presents today for follow-up of the MRI of her right knee as well as discussion.     12/06/24: Laura House is a 59 year old female with prior right hip labral tear who presents with acute right knee pain and mechanical symptoms following a fall. One week ago, while standing in her kitchen, she took two steps backward and felt a sudden snapping sensation in the posterior right leg, immediately followed by a fall onto the right knee when the leg gave out. Since then she has had persistent pain mainly in the posterior right knee with soreness in the popliteal fossa and lateral aspect. She cannot fully flex the knee and has pain at a specific point in flexion with radiation to the posterior knee. She can nearly but not completely extend the knee. She has intermittent popping in the right knee while walking. She denies prior right knee problems, locking, catching, or significant swelling. She notes localized tenderness in the posterior and lateral knee, which she attributes to the fall. Pain is tolerable at rest but worsens with flexion. She has treated with elevation, ice, a knee sleeve, and alternating Tylenol  Arthritis at night and Advil  twice daily, with only partial relief. She is usually very active with daily exercise, which is now limited by knee pain.     Surgical History:   None   PMH/PSH/Family History/Social History/Meds/Allergies:   No past medical history on file.     No past surgical history on file.     Social History         Socioeconomic History   Marital status: Unknown      Spouse name: Not on file   Number of children: Not on file   Years of education: Not on file   Highest education level: Not on file  Occupational History   Not on file  Tobacco Use   Smoking status: Not on file   Smokeless tobacco: Not on file  Substance and Sexual Activity   Alcohol use:  Not on file   Drug use: Not on file   Sexual activity: Not on file  Other Topics Concern   Not on file  Social History Narrative   Not on file    Social Drivers of Health        Tobacco Use: Medium Risk (02/04/2024)    Received from [facility]    Patient History     Passive Exposure: Not on file     Smokeless Tobacco Use: Never     Smoking Tobacco Use: Former  Programmer, 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     [Allergies]  [Allergies] Not on File No current outpatient medications on file.      No current facility-administered medications for this visit.      Imaging Results (Last 48 hours)  No results found.     Review of Systems:   A ROS was performed including  pertinent positives and negatives as documented in the HPI.   Physical Exam :   Constitutional: NAD and appears stated age Neurological: Alert and oriented Psych: Appropriate affect and cooperative There were no vitals taken for this visit.    Comprehensive Musculoskeletal Exam:     Exam of the right knee demonstrates tenderness palpation over the medial joint line.  Mild effusion without erythema or warmth.  Active range of motion from 5 to 110 degrees without crepitus.  No laxity or pain with varus or valgus stress.  Positive Lachman.  5/5 strength with knee flexion and extension.   Imaging:   MRI right knee: Isolated complete tear of the ACL with pivot shift contusion pattern.  Mild MCL sprain.  No evidence of meniscal injury.   I personally reviewed and interpreted the radiographs.          Assessment & Plan Right knee ACL tear Patient sustained a right knee injury 2 weeks ago  while taking a step backward inside of her home, causing her knee to buckle.  Recent MRI was reviewed today with patient which does unfortunately show a complete tear of the ACL.  We discussed in depth the limitations of ongoing knee instability in an ACL deficient knee as well as the likelihood to advance osteoarthritis in the future.  She does like to stay active therefore this would be a concern moving forward.  At this time she is ultimately elected for right knee arthroscopy with ACL reconstruction.  -Plan for right knee arthroscopy with quadriceps tendon allograft   After a lengthy discussion of treatment options, including risks, benefits, alternatives, complications of surgical and nonsurgical conservative options, the patient elected surgical repair.   The patient  is aware of the material risks  and complications including, but not limited to injury to adjacent structures, neurovascular injury, infection, numbness, bleeding, implant failure, thermal burns, stiffness, persistent pain, failure to heal, disease transmission from allograft, need for further surgery, dislocation, anesthetic risks, blood clots, risks of death,and others. The probabilities of surgical success and failure discussed with patient given their particular co-morbidities.The time and nature of expected rehabilitation and recovery was discussed.The patient's questions were all answered preoperatively.  No barriers to understanding were noted. I explained the natural history of the disease process and Rx rationale.  I explained to the patient what I considered to be reasonable expectations given their personal situation.  The final treatment plan was arrived at through a shared patient decision making process model.          I personally saw and evaluated the patient, and participated in the management and treatment plan.   "

## 2024-12-23 NOTE — Addendum Note (Signed)
 Addended by: WOLFGANG CONLEY HERO on: 12/23/2024 02:58 PM   Modules accepted: Orders

## 2025-01-02 ENCOUNTER — Encounter (HOSPITAL_BASED_OUTPATIENT_CLINIC_OR_DEPARTMENT_OTHER): Payer: Self-pay | Admitting: Orthopaedic Surgery

## 2025-01-02 ENCOUNTER — Other Ambulatory Visit: Payer: Self-pay

## 2025-01-06 NOTE — Progress Notes (Signed)

## 2025-01-10 ENCOUNTER — Ambulatory Visit (HOSPITAL_BASED_OUTPATIENT_CLINIC_OR_DEPARTMENT_OTHER): Admission: RE | Admit: 2025-01-10 | Admitting: Orthopaedic Surgery

## 2025-01-10 ENCOUNTER — Encounter (HOSPITAL_BASED_OUTPATIENT_CLINIC_OR_DEPARTMENT_OTHER): Payer: Self-pay

## 2025-01-10 HISTORY — DX: Hyperlipidemia, unspecified: E78.5

## 2025-01-10 HISTORY — DX: Nausea with vomiting, unspecified: R11.2

## 2025-01-10 HISTORY — DX: Depression, unspecified: F32.A

## 2025-01-10 HISTORY — DX: Anemia, unspecified: D64.9

## 2025-01-13 ENCOUNTER — Ambulatory Visit: Admitting: Physical Therapy

## 2025-01-16 ENCOUNTER — Ambulatory Visit: Admitting: Physical Therapy

## 2025-01-18 ENCOUNTER — Ambulatory Visit: Admitting: Physical Therapy

## 2025-01-20 ENCOUNTER — Ambulatory Visit: Admitting: Rehabilitative and Restorative Service Providers"

## 2025-01-23 ENCOUNTER — Ambulatory Visit: Admitting: Physical Therapy

## 2025-01-25 ENCOUNTER — Encounter (HOSPITAL_BASED_OUTPATIENT_CLINIC_OR_DEPARTMENT_OTHER): Admitting: Orthopaedic Surgery

## 2025-01-25 ENCOUNTER — Ambulatory Visit: Admitting: Physical Therapy

## 2025-01-27 ENCOUNTER — Ambulatory Visit: Admitting: Physical Therapy

## 2025-01-30 ENCOUNTER — Ambulatory Visit: Admitting: Physical Therapy

## 2025-02-01 ENCOUNTER — Ambulatory Visit: Admitting: Physical Therapy

## 2025-02-06 ENCOUNTER — Ambulatory Visit: Admitting: Physical Therapy

## 2025-02-08 ENCOUNTER — Ambulatory Visit: Admitting: Physical Therapy
